# Patient Record
Sex: Female | Born: 1990 | Race: White | Hispanic: No | Marital: Single | Smoking: Never smoker
Health system: Southern US, Community
[De-identification: ages and names within clinical notes are randomized; demographics above are authoritative.]

## PROBLEM LIST (undated history)

## (undated) ENCOUNTER — Inpatient Hospital Stay (HOSPITAL_COMMUNITY): Payer: Self-pay

## (undated) DIAGNOSIS — Z87410 Personal history of cervical dysplasia: Secondary | ICD-10-CM

## (undated) DIAGNOSIS — Z973 Presence of spectacles and contact lenses: Secondary | ICD-10-CM

## (undated) DIAGNOSIS — F3281 Premenstrual dysphoric disorder: Secondary | ICD-10-CM

## (undated) DIAGNOSIS — N920 Excessive and frequent menstruation with regular cycle: Secondary | ICD-10-CM

## (undated) DIAGNOSIS — T7840XA Allergy, unspecified, initial encounter: Secondary | ICD-10-CM

## (undated) DIAGNOSIS — T8339XA Other mechanical complication of intrauterine contraceptive device, initial encounter: Secondary | ICD-10-CM

## (undated) DIAGNOSIS — U071 COVID-19: Secondary | ICD-10-CM

## (undated) DIAGNOSIS — G43829 Menstrual migraine, not intractable, without status migrainosus: Secondary | ICD-10-CM

## (undated) DIAGNOSIS — M1990 Unspecified osteoarthritis, unspecified site: Secondary | ICD-10-CM

## (undated) DIAGNOSIS — Z8616 Personal history of COVID-19: Secondary | ICD-10-CM

## (undated) DIAGNOSIS — J310 Chronic rhinitis: Secondary | ICD-10-CM

## (undated) DIAGNOSIS — J45909 Unspecified asthma, uncomplicated: Secondary | ICD-10-CM

## (undated) DIAGNOSIS — F419 Anxiety disorder, unspecified: Secondary | ICD-10-CM

## (undated) DIAGNOSIS — O09299 Supervision of pregnancy with other poor reproductive or obstetric history, unspecified trimester: Secondary | ICD-10-CM

## (undated) DIAGNOSIS — Z9109 Other allergy status, other than to drugs and biological substances: Secondary | ICD-10-CM

## (undated) HISTORY — DX: Menstrual migraine, not intractable, without status migrainosus: G43.829

## (undated) HISTORY — DX: Other mechanical complication of intrauterine contraceptive device, initial encounter: T83.39XA

## (undated) HISTORY — PX: LAPAROSCOPY: SHX197

## (undated) HISTORY — DX: Anxiety disorder, unspecified: F41.9

## (undated) HISTORY — PX: DENTAL RESTORATION/EXTRACTION WITH X-RAY: SHX5796

## (undated) HISTORY — DX: Allergy, unspecified, initial encounter: T78.40XA

## (undated) HISTORY — DX: Premenstrual dysphoric disorder: F32.81

---
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
## 2010-08-09 HISTORY — PX: IUD REMOVAL: SHX5392

## 2010-08-09 HISTORY — PX: LAPAROSCOPY: SHX197

## 2013-08-09 NOTE — L&D Delivery Note (Signed)
Delivery Note At 6:21 PM a viable and healthy female was delivered via Vaginal, Spontaneous Delivery (Presentation: Right Occiput Anterior).  APGAR: 8, 9; weight 7 lb 3.3 oz (3270 g).   Placenta status: Intact, Spontaneous.  Cord: 3 vessels with the following complications: None.   Anesthesia: Epidural  Episiotomy: None Lacerations: None Suture Repair: N/A Est. Blood Loss (mL): 100  Mom to postpartum.  Baby to Couplet care / Skin to Skin.  Maurie Boettcherhompson, McKenzie L 07/18/2014, 7:09 PM   I attended the delivery with the resident and I agree with the note above  Adam PhenixJames G Anjanette Gilkey, MD 07/18/2014

## 2013-11-21 ENCOUNTER — Encounter: Payer: Self-pay | Admitting: Family Medicine

## 2013-11-21 ENCOUNTER — Ambulatory Visit (INDEPENDENT_AMBULATORY_CARE_PROVIDER_SITE_OTHER): Payer: BC Managed Care – PPO | Admitting: Family Medicine

## 2013-11-21 VITALS — BP 116/79 | HR 76 | Ht 59.0 in | Wt 110.0 lb

## 2013-11-21 DIAGNOSIS — N926 Irregular menstruation, unspecified: Secondary | ICD-10-CM

## 2013-11-21 DIAGNOSIS — J302 Other seasonal allergic rhinitis: Secondary | ICD-10-CM | POA: Insufficient documentation

## 2013-11-21 DIAGNOSIS — J309 Allergic rhinitis, unspecified: Secondary | ICD-10-CM

## 2013-11-21 DIAGNOSIS — Z3201 Encounter for pregnancy test, result positive: Secondary | ICD-10-CM

## 2013-11-21 DIAGNOSIS — Z8619 Personal history of other infectious and parasitic diseases: Secondary | ICD-10-CM

## 2013-11-21 LAB — POCT URINE PREGNANCY: PREG TEST UR: POSITIVE

## 2013-11-21 NOTE — Progress Notes (Signed)
CC: Alexandra Trujillo is a 23 y.o. female is here for Establish Care   Subjective: HPI:  Very pleasant 23 year old here to establish care  Patient reports first day of last menstrual period was 10/19/2013. Periods are usually extremely predictable, yesterday she noticed that her period was a few days late in the setting of breast engorgement and reflux which was early pregnancy signs of prior pregnancies. She used a home pregnancy test which was positive. She is here to confirm whether or not she is pregnant. She denies any tobacco alcohol or recreational drug use. She occasionally uses ibuprofen however rarely. She denies any prescription drug use in the past year. No prenatal vitamin use in the recent months. She denies any pelvic pain or vaginal bleeding since her last period.  This would be her fourth pregnancy.  Prior pregnancies were unremarkable other than GBS positive status with last pregnancy.  She was a nasal congestion, subjective postnasal drip, nonproductive cough that has been present for the majority of April. Symptoms are mild in severity on a daily basis. Improves with Benadryl however this comes with intolerable sedation.  Denies blood in sputum nor shortness of breath. This occurs annually this time of year when pollen counts are high   Review of Systems - General ROS: negative for - chills, fever, night sweats, weight gain or weight loss Ophthalmic ROS: negative for - decreased vision Psychological ROS: negative for - anxiety or depression ENT ROS: negative for - hearing change, nasal congestion, tinnitus or allergies Hematological and Lymphatic ROS: negative for - bleeding problems, bruising or swollen lymph nodes Respiratory ROS: no cough, shortness of breath, or wheezing Cardiovascular ROS: no chest pain or dyspnea on exertion Gastrointestinal ROS: no abdominal pain, change in bowel habits, or black or bloody stools Genito-Urinary ROS: negative for - genital discharge,  genital ulcers, incontinence or abnormal bleeding from genitals Musculoskeletal ROS: negative for - joint pain or muscle pain Neurological ROS: negative for - headaches or memory loss Dermatological ROS: negative for lumps, mole changes, rash and skin lesion changes  History reviewed. No pertinent past medical history.  History reviewed. No pertinent past surgical history. No family history on file.  History   Social History  . Marital Status: Single    Spouse Name: N/A    Number of Children: N/A  . Years of Education: N/A   Occupational History  . Not on file.   Social History Main Topics  . Smoking status: Never Smoker   . Smokeless tobacco: Not on file  . Alcohol Use: No  . Drug Use: No  . Sexual Activity: Yes    Partners: Male   Other Topics Concern  . Not on file   Social History Narrative  . No narrative on file     Objective: BP 116/79  Pulse 76  Ht 4\' 11"  (1.499 m)  Wt 110 lb (49.896 kg)  BMI 22.21 kg/m2  LMP 10/21/2013  General: Alert and Oriented, No Acute Distress HEENT: Pupils equal, round, reactive to light. Conjunctivae clear.  External ears unremarkable, canals clear with intact TMs with appropriate landmarks.  Middle ear appears open without effusion. Pink inferior turbinates.  Moist mucous membranes, pharynx without inflammation nor lesions however moderate cobblestoning and postnasal drip.  Neck supple without palpable lymphadenopathy nor abnormal masses. Lungs: Clear to auscultation bilaterally, no wheezing/ronchi/rales.  Comfortable work of breathing. Good air movement. Cardiac: Regular rate and rhythm. Normal S1/S2.  No murmurs, rubs, nor gallops.   Extremities: No peripheral edema.  Strong peripheral pulses.  Mental Status: No depression, anxiety, nor agitation. Skin: Warm and dry.  Assessment & Plan: Alexandra Trujillo was seen today for establish care.  Diagnoses and associated orders for this visit:  Missed period - Ambulatory referral to Obstetrics  / Gynecology  Pregnancy test positive - Ambulatory referral to Obstetrics / Gynecology  History of group B Streptococcus (GBS) infection  Seasonal allergies    Missed period with positive pregnancy test: Will refer to her OB/GYN colleagues I encouraged her to start a daily multivitamin. Avoid ibuprofen, instead use Tylenol for minor aches and pains. Continue avoidance of tobacco and alcohol. Consider Unisom for any nausea pending recommendations from OB/GYN. Notify us as soon as possible for any pelvic pain and/or vaginal bleeding. Seasonal allergies: Discussed that Zyrtec is category B for pregnancy, encouraged her to use this as needed for her symptoms unless told otherwise by OB/GYN    Return if symptoms worsen or fail to improve.

## 2013-11-21 NOTE — Addendum Note (Signed)
Addended by: Wyline BeadyMCCRIMMON, Kaisey Huseby C on: 11/21/2013 09:44 AM   Modules accepted: Orders

## 2013-12-17 ENCOUNTER — Encounter: Payer: Self-pay | Admitting: Advanced Practice Midwife

## 2013-12-17 ENCOUNTER — Ambulatory Visit (INDEPENDENT_AMBULATORY_CARE_PROVIDER_SITE_OTHER): Payer: BC Managed Care – PPO | Admitting: Advanced Practice Midwife

## 2013-12-17 VITALS — BP 120/78 | HR 103 | Wt 112.0 lb

## 2013-12-17 DIAGNOSIS — Z3491 Encounter for supervision of normal pregnancy, unspecified, first trimester: Secondary | ICD-10-CM | POA: Insufficient documentation

## 2013-12-17 DIAGNOSIS — Z124 Encounter for screening for malignant neoplasm of cervix: Secondary | ICD-10-CM

## 2013-12-17 DIAGNOSIS — Z3480 Encounter for supervision of other normal pregnancy, unspecified trimester: Secondary | ICD-10-CM

## 2013-12-17 DIAGNOSIS — Z1151 Encounter for screening for human papillomavirus (HPV): Secondary | ICD-10-CM

## 2013-12-17 NOTE — Progress Notes (Signed)
   Subjective:    Alexandra Trujillo is a G4P3 5024w4d being seen today for her first obstetrical visit.  Her obstetrical history is significant for nothing.. Patient does intend to breast feed. Pregnancy history fully reviewed.  Patient reports no bleeding and no cramping.  Filed Vitals:   12/17/13 1036  BP: 120/78  Pulse: 103  Weight: 112 lb (50.803 kg)    HISTORY: OB History  Gravida Para Term Preterm AB SAB TAB Ectopic Multiple Living  4 3        3     # Outcome Date GA Lbr Len/2nd Weight Sex Delivery Anes PTL Lv  4 CUR           3 PAR 03/07/11 82324w4d  7 lb 8 oz (3.402 kg) M      2 PAR 11/14/08 7532w0d  7 lb 2 oz (3.232 kg) M      1 PAR 09/02/07 3664w0d  7 lb 4 oz (3.289 kg) M         History reviewed. No pertinent past medical history. Past Surgical History  Procedure Laterality Date  . Iud removal  2012    malposition   Family History  Problem Relation Age of Onset  . Thyroid disease Maternal Aunt      Exam    Uterus:     Pelvic Exam:    Perineum: No Hemorrhoids, Normal Perineum   Vulva: normal, Bartholin's, Urethra, Skene's normal   Vagina:  normal mucosa, normal discharge   pH: NA   Cervix: multiparous appearance, non-friable   Adnexa: normal adnexa and no mass, fullness, tenderness   Bony Pelvis: average  System: Breast:  normal appearance, no masses or tenderness, No nipple retraction or dimpling, No nipple discharge or bleeding, No axillary or supraclavicular adenopathy, Normal to palpation without dominant masses, Taught monthly breast self examination   Skin: dermatitis noted: rash in bilat feet.     Neurologic: oriented, normal mood, gait normal; reflexes normal and symmetric   Extremities: No edema   HEENT sclera clear, anicteric   Mouth/Teeth mucous membranes moist, pharynx normal without lesions and dental hygiene good   Neck supple and no masses   Cardiovascular: regular rate and rhythm, no murmurs or gallops   Respiratory:  appears well, vitals  normal, no respiratory distress, acyanotic, normal RR, neck free of mass or lymphadenopathy, chest clear, no wheezing, crepitations, rhonchi, normal symmetric air entry   Abdomen: soft, non-tender; bowel sounds normal; no masses,  no organomegaly   Urinary: urethral meatus normal      Assessment:    Pregnancy: G4P3 Patient Active Problem List   Diagnosis Date Noted  . Supervision of normal pregnancy in first trimester 12/17/2013  . History of group B Streptococcus (GBS) infection 11/21/2013  . Seasonal allergies 11/21/2013        Plan:     Initial labs drawn. Prenatal vitamins. Problem list reviewed and updated. Genetic Screening discussed First Screen and Quad Screen: declined.  Ultrasound discussed; fetal survey: requested.  Follow up in 4 weeks. 50% of 30 min visit spent on counseling and coordination of care.    Dorathy KinsmanVirginia Dawt Reeb 12/17/2013

## 2013-12-17 NOTE — Progress Notes (Signed)
Initial Prenatal visit today - 4th pregnancy - Positive Group B last pregnancy

## 2013-12-17 NOTE — Patient Instructions (Signed)
Your estimated due date is 07/25/2014.  Pregnancy - First Trimester During sexual intercourse, millions of sperm go into the vagina. Only 1 sperm will penetrate and fertilize the female egg while it is in the Fallopian tube. One week later, the fertilized egg implants into the wall of the uterus. An embryo begins to develop into a baby. At 6 to 8 weeks, the eyes and face are formed and the heartbeat can be seen on ultrasound. At the end of 12 weeks (first trimester), all the baby's organs are formed. Now that you are pregnant, you will want to do everything you can to have a healthy baby. Two of the most important things are to get good prenatal care and follow your caregiver's instructions. Prenatal care is all the medical care you receive before the baby's birth. It is given to prevent, find, and treat problems during the pregnancy and childbirth. PRENATAL EXAMS  During prenatal visits, your weight, blood pressure, and urine are checked. This is done to make sure you are healthy and progressing normally during the pregnancy.  A pregnant woman should gain 25 to 35 pounds during the pregnancy. However, if you are overweight or underweight, your caregiver will advise you regarding your weight.  Your caregiver will ask and answer questions for you.  Blood work, cervical cultures, other necessary tests, and a Pap test are done during your prenatal exams. These tests are done to check on your health and the probable health of your baby. Tests are strongly recommended and done for HIV with your permission. This is the virus that causes AIDS. These tests are done because medicines can be given to help prevent your baby from being born with this infection should you have been infected without knowing it. Blood work is also used to find out your blood type, previous infections, and follow your blood levels (hemoglobin).  Low hemoglobin (anemia) is common during pregnancy. Iron and vitamins are given to help  prevent this. Later in the pregnancy, blood tests for diabetes will be done along with any other tests if any problems develop.  You may need other tests to make sure you and the baby are doing well. CHANGES DURING THE FIRST TRIMESTER  Your body goes through many changes during pregnancy. They vary from person to person. Talk to your caregiver about changes you notice and are concerned about. Changes can include:  Your menstrual period stops.  The egg and sperm carry the genes that determine what you look like. Genes from you and your partner are forming a baby. The female genes determine whether the baby is a boy or a girl.  Your body increases in girth and you may feel bloated.  Feeling sick to your stomach (nauseous) and throwing up (vomiting). If the vomiting is uncontrollable, call your caregiver.  Your breasts will begin to enlarge and become tender.  Your nipples may stick out more and become darker.  The need to urinate more. Painful urination may mean you have a bladder infection.  Tiring easily.  Loss of appetite.  Cravings for certain kinds of food.  At first, you may gain or lose a couple of pounds.  You may have changes in your emotions from day to day (excited to be pregnant or concerned something may go wrong with the pregnancy and baby).  You may have more vivid and strange dreams. HOME CARE INSTRUCTIONS   It is very important to avoid all smoking, alcohol and non-prescribed drugs during your pregnancy. These affect  the formation and growth of the baby. Avoid chemicals while pregnant to ensure the delivery of a healthy infant.  Start your prenatal visits by the 12th week of pregnancy. They are usually scheduled monthly at first, then more often in the last 2 months before delivery. Keep your caregiver's appointments. Follow your caregiver's instructions regarding medicine use, blood and lab tests, exercise, and diet.  During pregnancy, you are providing food for you  and your baby. Eat regular, well-balanced meals. Choose foods such as meat, fish, milk and other low fat dairy products, vegetables, fruits, and whole-grain breads and cereals. Your caregiver will tell you of the ideal weight gain.  You can help morning sickness by keeping soda crackers at the bedside. Eat a couple before arising in the morning. You may want to use the crackers without salt on them.  Eating 4 to 5 small meals rather than 3 large meals a day also may help the nausea and vomiting.  Drinking liquids between meals instead of during meals also seems to help nausea and vomiting.  A physical sexual relationship may be continued throughout pregnancy if there are no other problems. Problems may be early (premature) leaking of amniotic fluid from the membranes, vaginal bleeding, or belly (abdominal) pain.  Exercise regularly if there are no restrictions. Check with your caregiver or physical therapist if you are unsure of the safety of some of your exercises. Greater weight gain will occur in the last 2 trimesters of pregnancy. Exercising will help:  Control your weight.  Keep you in shape.  Prepare you for labor and delivery.  Help you lose your pregnancy weight after you deliver your baby.  Wear a good support or jogging bra for breast tenderness during pregnancy. This may help if worn during sleep too.  Ask when prenatal classes are available. Begin classes when they are offered.  Do not use hot tubs, steam rooms, or saunas.  Wear your seat belt when driving. This protects you and your baby if you are in an accident.  Avoid raw meat, uncooked cheese, cat litter boxes, and soil used by cats throughout the pregnancy. These carry germs that can cause birth defects in the baby.  The first trimester is a good time to visit your dentist for your dental health. Getting your teeth cleaned is okay. Use a softer toothbrush and brush gently during pregnancy.  Ask for help if you have  financial, counseling, or nutritional needs during pregnancy. Your caregiver will be able to offer counseling for these needs as well as refer you for other special needs.  Do not take any medicines or herbs unless told by your caregiver.  Inform your caregiver if there is any mental or physical domestic violence.  Make a list of emergency phone numbers of family, friends, hospital, and police and fire departments.  Write down your questions. Take them to your prenatal visit.  Do not douche.  Do not cross your legs.  If you have to stand for long periods of time, rotate you feet or take small steps in a circle.  You may have more vaginal secretions that may require a sanitary pad. Do not use tampons or scented sanitary pads. MEDICINES AND DRUG USE IN PREGNANCY  Take prenatal vitamins as directed. The vitamin should contain 1 milligram of folic acid. Keep all vitamins out of reach of children. Only a couple vitamins or tablets containing iron may be fatal to a baby or young child when ingested.  Avoid use of  all medicines, including herbs, over-the-counter medicines, not prescribed or suggested by your caregiver. Only take over-the-counter or prescription medicines for pain, discomfort, or fever as directed by your caregiver. Do not use aspirin, ibuprofen, or naproxen unless directed by your caregiver.  Let your caregiver also know about herbs you may be using.  Alcohol is related to a number of birth defects. This includes fetal alcohol syndrome. All alcohol, in any form, should be avoided completely. Smoking will cause low birth rate and premature babies.  Street or illegal drugs are very harmful to the baby. They are absolutely forbidden. A baby born to an addicted mother will be addicted at birth. The baby will go through the same withdrawal an adult does.  Let your caregiver know about any medicines that you have to take and for what reason you take them. SEEK MEDICAL CARE IF:  You  have any concerns or worries during your pregnancy. It is better to call with your questions if you feel they cannot wait, rather than worry about them. SEEK IMMEDIATE MEDICAL CARE IF:   An unexplained oral temperature above 102 F (38.9 C) develops, or as your caregiver suggests.  You have leaking of fluid from the vagina (birth canal). If leaking membranes are suspected, take your temperature and inform your caregiver of this when you call.  There is vaginal spotting or bleeding. Notify your caregiver of the amount and how many pads are used.  You develop a bad smelling vaginal discharge with a change in the color.  You continue to feel sick to your stomach (nauseated) and have no relief from remedies suggested. You vomit blood or coffee ground-like materials.  You lose more than 2 pounds of weight in 1 week.  You gain more than 2 pounds of weight in 1 week and you notice swelling of your face, hands, feet, or legs.  You gain 5 pounds or more in 1 week (even if you do not have swelling of your hands, face, legs, or feet).  You get exposed to [place] measles and have never had them.  You are exposed to fifth disease or chickenpox.  You develop belly (abdominal) pain. Round ligament discomfort is a common non-cancerous (benign) cause of abdominal pain in pregnancy. Your caregiver still must evaluate this.  You develop headache, fever, diarrhea, pain with urination, or shortness of breath.  You fall or are in a car accident or have any kind of trauma.  There is mental or physical violence in your home. Document Released: 07/20/2001 Document Revised: 04/19/2012 Document Reviewed: 01/21/2009 [facility] Patient Information 2014 [place], [place].   Breastfeeding Deciding to breastfeed is one of the best choices you can make for you and your baby. A change in hormones during pregnancy causes your breast tissue to grow and increases the number and size of your milk ducts. These hormones also  allow proteins, sugars, and fats from your blood supply to make breast milk in your milk-producing glands. Hormones prevent breast milk from being released before your baby is born as well as prompt milk flow after birth. Once breastfeeding has begun, thoughts of your baby, as well as his or her sucking or crying, can stimulate the release of milk from your milk-producing glands.  BENEFITS OF BREASTFEEDING For Your Baby  Your first milk (colostrum) helps your baby's digestive system function better.   There are antibodies in your milk that help your baby fight off infections.   Your baby has a lower incidence of asthma, allergies, and  sudden infant death syndrome.   The nutrients in breast milk are better for your baby than infant formulas and are designed uniquely for your baby's needs.   Breast milk improves your baby's brain development.   Your baby is less likely to develop other conditions, such as childhood obesity, asthma, or type 2 diabetes mellitus.  For You   Breastfeeding helps to create a very special bond between you and your baby.   Breastfeeding is convenient. Breast milk is always available at the correct temperature and costs nothing.   Breastfeeding helps to burn calories and helps you lose the weight gained during pregnancy.   Breastfeeding makes your uterus contract to its prepregnancy size faster and slows bleeding (lochia) after you give birth.   Breastfeeding helps to lower your risk of developing type 2 diabetes mellitus, osteoporosis, and breast or ovarian cancer later in life. SIGNS THAT YOUR BABY IS HUNGRY Early Signs of Hunger  Increased alertness or activity.  Stretching.  Movement of the head from side to side.  Movement of the head and opening of the mouth when the corner of the mouth or cheek is stroked (rooting).  Increased sucking sounds, smacking lips, cooing, sighing, or squeaking.  Hand-to-mouth movements.  Increased sucking of  fingers or hands. Late Signs of Hunger  Fussing.  Intermittent crying. Extreme Signs of Hunger Signs of extreme hunger will require calming and consoling before your baby will be able to breastfeed successfully. Do not wait for the following signs of extreme hunger to occur before you initiate breastfeeding:   Restlessness.  A loud, strong cry.   Screaming. BREASTFEEDING BASICS Breastfeeding Initiation  Find a comfortable place to sit or lie down, with your neck and back well supported.  Place a pillow or rolled up blanket under your baby to bring him or her to the level of your breast (if you are seated). Nursing pillows are specially designed to help support your arms and your baby while you breastfeed.  Make sure that your baby's abdomen is facing your abdomen.   Gently massage your breast. With your fingertips, massage from your chest wall toward your nipple in a circular motion. This encourages milk flow. You may need to continue this action during the feeding if your milk flows slowly.  Support your breast with 4 fingers underneath and your thumb above your nipple. Make sure your fingers are well away from your nipple and your baby's mouth.   Stroke your baby's lips gently with your finger or nipple.   When your baby's mouth is open wide enough, quickly bring your baby to your breast, placing your entire nipple and as much of the colored area around your nipple (areola) as possible into your baby's mouth.   More areola should be visible above your baby's upper lip than below the lower lip.   Your baby's tongue should be between his or her lower gum and your breast.   Ensure that your baby's mouth is correctly positioned around your nipple (latched). Your baby's lips should create a seal on your breast and be turned out (everted).  It is common for your baby to suck about 2 3 minutes in order to start the flow of breast milk. Latching Teaching your baby how to latch  on to your breast properly is very important. An improper latch can cause nipple pain and decreased milk supply for you and poor weight gain in your baby. Also, if your baby is not latched onto your nipple properly,  he or she may swallow some air during feeding. This can make your baby fussy. Burping your baby when you switch breasts during the feeding can help to get rid of the air. However, teaching your baby to latch on properly is still the best way to prevent fussiness from swallowing air while breastfeeding. Signs that your baby has successfully latched on to your nipple:    Silent tugging or silent sucking, without causing you pain.   Swallowing heard between every 3 4 sucks.    Muscle movement above and in front of his or her ears while sucking.  Signs that your baby has not successfully latched on to nipple:   Sucking sounds or smacking sounds from your baby while breastfeeding.  Nipple pain. If you think your baby has not latched on correctly, slip your finger into the corner of your baby's mouth to break the suction and place it between your baby's gums. Attempt breastfeeding initiation again. Signs of Successful Breastfeeding Signs from your baby:   A gradual decrease in the number of sucks or complete cessation of sucking.   Falling asleep.   Relaxation of his or her body.   Retention of a small amount of milk in his or her mouth.   Letting go of your breast by himself or herself. Signs from you:  Breasts that have increased in firmness, weight, and size 1 3 hours after feeding.   Breasts that are softer immediately after breastfeeding.  Increased milk volume, as well as a change in milk consistency and color by the 5th day of breastfeeding.   Nipples that are not sore, cracked, or bleeding. Signs That Your Pecola Leisure is Getting Enough Milk  Wetting at least 3 diapers in a 24-hour period. The urine should be clear and pale yellow by age 93 days.  At least 3  stools in a 24-hour period by age 93 days. The stool should be soft and yellow.  At least 3 stools in a 24-hour period by age 659 days. The stool should be seedy and yellow.  No loss of weight greater than 10% of birth weight during the first 82 days of age.  Average weight gain of 4 7 ounces (120 210 mL) per week after age 653 days.  Consistent daily weight gain by age 93 days, without weight loss after the age of 2 weeks. After a feeding, your baby may spit up a small amount. This is common. BREASTFEEDING FREQUENCY AND DURATION Frequent feeding will help you make more milk and can prevent sore nipples and breast engorgement. Breastfeed when you feel the need to reduce the fullness of your breasts or when your baby shows signs of hunger. This is called "breastfeeding on demand." Avoid introducing a pacifier to your baby while you are working to establish breastfeeding (the first 4 6 weeks after your baby is born). After this time you may choose to use a pacifier. Research has shown that pacifier use during the first year of a baby's life decreases the risk of sudden infant death syndrome (SIDS). Allow your baby to feed on each breast as long as he or she wants. Breastfeed until your baby is finished feeding. When your baby unlatches or falls asleep while feeding from the first breast, offer the second breast. Because newborns are often sleepy in the first few weeks of life, you may need to awaken your baby to get him or her to feed. Breastfeeding times will vary from baby to baby. However, the following rules  can serve as a guide to help you ensure that your baby is properly fed:  Newborns (babies 49 weeks of age or younger) may breastfeed every 1 3 hours.  Newborns should not go longer than 3 hours during the day or 5 hours during the night without breastfeeding.  You should breastfeed your baby a minimum of 8 times in a 24-hour period until you begin to introduce solid foods to your baby at around 58  months of age. BREAST MILK PUMPING Pumping and storing breast milk allows you to ensure that your baby is exclusively fed your breast milk, even at times when you are unable to breastfeed. This is especially important if you are going back to work while you are still breastfeeding or when you are not able to be present during feedings. Your lactation consultant can give you guidelines on how long it is safe to store breast milk.  A breast pump is a machine that allows you to pump milk from your breast into a sterile bottle. The pumped breast milk can then be stored in a refrigerator or freezer. Some breast pumps are operated by hand, while others use electricity. Ask your lactation consultant which type will work best for you. Breast pumps can be purchased, but some hospitals and breastfeeding support groups lease breast pumps on a monthly basis. A lactation consultant can teach you how to hand express breast milk, if you prefer not to use a pump.  CARING FOR YOUR BREASTS WHILE YOU BREASTFEED Nipples can become dry, cracked, and sore while breastfeeding. The following recommendations can help keep your breasts moisturized and healthy:  Avoid using soap on your nipples.   Wear a supportive bra. Although not required, special nursing bras and tank tops are designed to allow access to your breasts for breastfeeding without taking off your entire bra or top. Avoid wearing underwire style bras or extremely tight bras.  Air dry your nipples for 3 after each feeding.   Use only cotton bra pads to absorb leaked breast milk. Leaking of breast milk between feedings is normal.   Use lanolin on your nipples after breastfeeding. Lanolin helps to maintain your skin's normal moisture barrier. If you use pure lanolin you do not need to wash it off before feeding your baby again. Pure lanolin is not toxic to your baby. You may also hand express a few drops of breast milk and gently massage that milk into  your nipples and allow the milk to air dry. In the first few weeks after giving birth, some women experience extremely full breasts (engorgement). Engorgement can make your breasts feel heavy, warm, and tender to the touch. Engorgement peaks within 3 5 days after you give birth. The following recommendations can help ease engorgement:  Completely empty your breasts while breastfeeding or pumping. You may want to start by applying warm, moist heat (in the shower or with warm water-soaked hand towels) just before feeding or pumping. This increases circulation and helps the milk flow. If your baby does not completely empty your breasts while breastfeeding, pump any extra milk after he or she is finished.  Wear a snug bra (nursing or regular) or tank top for 1 2 days to signal your body to slightly decrease milk production.  Apply ice packs to your breasts, unless this is too uncomfortable for you.  Make sure that your baby is latched on and positioned properly while breastfeeding. If engorgement persists after 48 hours of following these recommendations, contact your  health care provider or a Advertising copywriterlactation consultant. OVERALL HEALTH CARE RECOMMENDATIONS WHILE BREASTFEEDING  Eat healthy foods. Alternate between meals and snacks, eating 3 of each per day. Because what you eat affects your breast milk, some of the foods may make your baby more irritable than usual. Avoid eating these foods if you are sure that they are negatively affecting your baby.  Drink milk, fruit juice, and water to satisfy your thirst (about 10 glasses a day).   Rest often, relax, and continue to take your prenatal vitamins to prevent fatigue, stress, and anemia.  Continue breast self-awareness checks.  Avoid chewing and smoking tobacco.  Avoid alcohol and drug use. Some medicines that may be harmful to your baby can pass through breast milk. It is important to ask your health care provider before taking any medicine, including  all over-the-counter and prescription medicine as well as vitamin and herbal supplements. It is possible to become pregnant while breastfeeding. If birth control is desired, ask your health care provider about options that will be safe for your baby. SEEK MEDICAL CARE IF:   You feel like you want to stop breastfeeding or have become frustrated with breastfeeding.  You have painful breasts or nipples.  Your nipples are cracked or bleeding.  Your breasts are red, tender, or warm.  You have a swollen area on either breast.  You have a fever or chills.  You have nausea or vomiting.  You have drainage other than breast milk from your nipples.  Your breasts do not become full before feedings by the 5th day after you give birth.  You feel sad and depressed.  Your baby is too sleepy to eat well.  Your baby is having trouble sleeping.   Your baby is wetting less than 3 diapers in a 24-hour period.  Your baby has less than 3 stools in a 24-hour period.  Your baby's skin or the white part of his or her eyes becomes yellow.   Your baby is not gaining weight by 215 days of age. SEEK IMMEDIATE MEDICAL CARE IF:   Your baby is overly tired (lethargic) and does not want to wake up and feed.  Your baby develops an unexplained fever. Document Released: 07/26/2005 Document Revised: 03/28/2013 Document Reviewed: 01/17/2013 [facility]Exit[facility] Patient Information 2014 [place]ExitCare, [place].

## 2013-12-18 LAB — OBSTETRIC PANEL
Antibody Screen: NEGATIVE
BASOS ABS: 0 10*3/uL (ref 0.0–0.1)
BASOS PCT: 0 % (ref 0–1)
EOS ABS: 0.1 10*3/uL (ref 0.0–0.7)
Eosinophils Relative: 1 % (ref 0–5)
HCT: 36.7 % (ref 36.0–46.0)
HEMOGLOBIN: 12.4 g/dL (ref 12.0–15.0)
HEP B S AG: NEGATIVE
Lymphocytes Relative: 20 % (ref 12–46)
Lymphs Abs: 1.9 10*3/uL (ref 0.7–4.0)
MCH: 29.8 pg (ref 26.0–34.0)
MCHC: 33.8 g/dL (ref 30.0–36.0)
MCV: 88.2 fL (ref 78.0–100.0)
MONOS PCT: 4 % (ref 3–12)
Monocytes Absolute: 0.4 10*3/uL (ref 0.1–1.0)
NEUTROS PCT: 75 % (ref 43–77)
Neutro Abs: 7 10*3/uL (ref 1.7–7.7)
Platelets: 190 10*3/uL (ref 150–400)
RBC: 4.16 MIL/uL (ref 3.87–5.11)
RDW: 13.6 % (ref 11.5–15.5)
RH TYPE: POSITIVE
Rubella: 0.75 Index (ref <0.90)
WBC: 9.3 10*3/uL (ref 4.0–10.5)

## 2013-12-18 LAB — HIV ANTIBODY (ROUTINE TESTING W REFLEX): HIV 1&2 Ab, 4th Generation: NONREACTIVE

## 2013-12-18 LAB — GC/CHLAMYDIA PROBE AMP
CT PROBE, AMP APTIMA: NEGATIVE
GC Probe RNA: NEGATIVE

## 2013-12-18 LAB — CULTURE, OB URINE

## 2014-01-14 ENCOUNTER — Ambulatory Visit (INDEPENDENT_AMBULATORY_CARE_PROVIDER_SITE_OTHER): Payer: BC Managed Care – PPO | Admitting: Advanced Practice Midwife

## 2014-01-14 VITALS — BP 110/69 | HR 83 | Wt 114.0 lb

## 2014-01-14 DIAGNOSIS — Z3480 Encounter for supervision of other normal pregnancy, unspecified trimester: Secondary | ICD-10-CM

## 2014-01-14 NOTE — Progress Notes (Signed)
Doing well.  Denies vaginal bleeding, LOF, cramping/contractions.  Denies problems/questions today.  Hx GBS positive with one pregnancy, was on problem list.  No complications or problems with baby r/t GBS so removed from pt chart. Plan for screen for GBS at 35-37 weeks this pregnancy.

## 2014-02-11 ENCOUNTER — Encounter: Payer: Self-pay | Admitting: Family

## 2014-02-11 ENCOUNTER — Ambulatory Visit (INDEPENDENT_AMBULATORY_CARE_PROVIDER_SITE_OTHER): Payer: BC Managed Care – PPO | Admitting: Family

## 2014-02-11 VITALS — BP 126/78 | HR 105 | Wt 115.0 lb

## 2014-02-11 DIAGNOSIS — O09299 Supervision of pregnancy with other poor reproductive or obstetric history, unspecified trimester: Secondary | ICD-10-CM

## 2014-02-11 DIAGNOSIS — Z2839 Other underimmunization status: Secondary | ICD-10-CM

## 2014-02-11 DIAGNOSIS — Z3491 Encounter for supervision of normal pregnancy, unspecified, first trimester: Secondary | ICD-10-CM

## 2014-02-11 DIAGNOSIS — O09292 Supervision of pregnancy with other poor reproductive or obstetric history, second trimester: Secondary | ICD-10-CM

## 2014-02-11 DIAGNOSIS — O09899 Supervision of other high risk pregnancies, unspecified trimester: Secondary | ICD-10-CM | POA: Insufficient documentation

## 2014-02-11 DIAGNOSIS — Z3480 Encounter for supervision of other normal pregnancy, unspecified trimester: Secondary | ICD-10-CM

## 2014-02-11 DIAGNOSIS — O9989 Other specified diseases and conditions complicating pregnancy, childbirth and the puerperium: Secondary | ICD-10-CM

## 2014-02-11 DIAGNOSIS — Z283 Underimmunization status: Secondary | ICD-10-CM

## 2014-02-11 HISTORY — DX: Supervision of pregnancy with other poor reproductive or obstetric history, unspecified trimester: O09.299

## 2014-02-11 NOTE — Progress Notes (Signed)
Reviewed OB labs, rubella non-immune, MMR pp.  Scheduled anatomy ultrasound.

## 2014-03-04 ENCOUNTER — Ambulatory Visit (HOSPITAL_COMMUNITY)
Admission: RE | Admit: 2014-03-04 | Discharge: 2014-03-04 | Disposition: A | Discharge status: Home / Self Care | Payer: Federal, State, Local not specified - PPO | Source: Ambulatory Visit | Attending: Family | Admitting: Family

## 2014-03-04 DIAGNOSIS — Z3491 Encounter for supervision of normal pregnancy, unspecified, first trimester: Secondary | ICD-10-CM

## 2014-03-04 DIAGNOSIS — Z3689 Encounter for other specified antenatal screening: Secondary | ICD-10-CM | POA: Insufficient documentation

## 2014-03-05 ENCOUNTER — Encounter: Payer: Self-pay | Admitting: Family

## 2014-03-11 ENCOUNTER — Ambulatory Visit (INDEPENDENT_AMBULATORY_CARE_PROVIDER_SITE_OTHER): Payer: BC Managed Care – PPO | Admitting: Advanced Practice Midwife

## 2014-03-11 ENCOUNTER — Encounter: Payer: Self-pay | Admitting: Advanced Practice Midwife

## 2014-03-11 VITALS — BP 119/71 | HR 119 | Wt 119.0 lb

## 2014-03-11 DIAGNOSIS — Z3480 Encounter for supervision of other normal pregnancy, unspecified trimester: Secondary | ICD-10-CM

## 2014-03-11 DIAGNOSIS — Z3492 Encounter for supervision of normal pregnancy, unspecified, second trimester: Secondary | ICD-10-CM

## 2014-03-11 NOTE — Patient Instructions (Signed)
Second Trimester of Pregnancy The second trimester is from week 13 through week 28, months 4 through 6. The second trimester is often a time when you feel your best. Your body has also adjusted to being pregnant, and you begin to feel better physically. Usually, morning sickness has lessened or quit completely, you may have more energy, and you may have an increase in appetite. The second trimester is also a time when the fetus is growing rapidly. At the end of the sixth month, the fetus is about 9 inches long and weighs about 1 pounds. You will likely begin to feel the baby move (quickening) between 18 and 20 weeks of the pregnancy. BODY CHANGES Your body goes through many changes during pregnancy. The changes vary from woman to woman.   Your weight will continue to increase. You will notice your lower abdomen bulging out.  You may begin to get stretch marks on your hips, abdomen, and breasts.  You may develop headaches that can be relieved by medicines approved by your health care provider.  You may urinate more often because the fetus is pressing on your bladder.  You may develop or continue to have heartburn as a result of your pregnancy.  You may develop constipation because certain hormones are causing the muscles that push waste through your intestines to slow down.  You may develop hemorrhoids or swollen, bulging veins (varicose veins).  You may have back pain because of the weight gain and pregnancy hormones relaxing your joints between the bones in your pelvis and as a result of a shift in weight and the muscles that support your balance.  Your breasts will continue to grow and be tender.  Your gums may bleed and may be sensitive to brushing and flossing.  Dark spots or blotches (chloasma, mask of pregnancy) may develop on your face. This will likely fade after the baby is born.  A dark line from your belly button to the pubic area (linea nigra) may appear. This will likely fade  after the baby is born.  You may have changes in your hair. These can include thickening of your hair, rapid growth, and changes in texture. Some women also have hair loss during or after pregnancy, or hair that feels dry or thin. Your hair will most likely return to normal after your baby is born. WHAT TO EXPECT AT YOUR PRENATAL VISITS During a routine prenatal visit:  You will be weighed to make sure you and the fetus are growing normally.  Your blood pressure will be taken.  Your abdomen will be measured to track your baby's growth.  The fetal heartbeat will be listened to.  Any test results from the previous visit will be discussed. Your health care provider may ask you:  How you are feeling.  If you are feeling the baby move.  If you have had any abnormal symptoms, such as leaking fluid, bleeding, severe headaches, or abdominal cramping.  If you have any questions. Other tests that may be performed during your second trimester include:  Blood tests that check for:  Low iron levels (anemia).  Gestational diabetes (between 24 and 28 weeks).  Rh antibodies.  Urine tests to check for infections, diabetes, or protein in the urine.  An ultrasound to confirm the proper growth and development of the baby.  An amniocentesis to check for possible genetic problems.  Fetal screens for spina bifida and Down syndrome. HOME CARE INSTRUCTIONS   Avoid all smoking, herbs, alcohol, and unprescribed   drugs. These chemicals affect the formation and growth of the baby.  Follow your health care provider's instructions regarding medicine use. There are medicines that are either safe or unsafe to take during pregnancy.  Exercise only as directed by your health care provider. Experiencing uterine cramps is a good sign to stop exercising.  Continue to eat regular, healthy meals.  Wear a good support bra for breast tenderness.  Do not use hot tubs, steam rooms, or saunas.  Wear your  seat belt at all times when driving.  Avoid raw meat, uncooked cheese, cat litter boxes, and soil used by cats. These carry germs that can cause birth defects in the baby.  Take your prenatal vitamins.  Try taking a stool softener (if your health care provider approves) if you develop constipation. Eat more high-fiber foods, such as fresh vegetables or fruit and whole grains. Drink plenty of fluids to keep your urine clear or pale yellow.  Take warm sitz baths to soothe any pain or discomfort caused by hemorrhoids. Use hemorrhoid cream if your health care provider approves.  If you develop varicose veins, wear support hose. Elevate your feet for 15 minutes, 3-4 times a day. Limit salt in your diet.  Avoid heavy lifting, wear low heel shoes, and practice good posture.  Rest with your legs elevated if you have leg cramps or low back pain.  Visit your dentist if you have not gone yet during your pregnancy. Use a soft toothbrush to brush your teeth and be gentle when you floss.  A sexual relationship may be continued unless your health care provider directs you otherwise.  Continue to go to all your prenatal visits as directed by your health care provider. SEEK MEDICAL CARE IF:   You have dizziness.  You have mild pelvic cramps, pelvic pressure, or nagging pain in the abdominal area.  You have persistent nausea, vomiting, or diarrhea.  You have a bad smelling vaginal discharge.  You have pain with urination. SEEK IMMEDIATE MEDICAL CARE IF:   You have a fever.  You are leaking fluid from your vagina.  You have spotting or bleeding from your vagina.  You have severe abdominal cramping or pain.  You have rapid weight gain or loss.  You have shortness of breath with chest pain.  You notice sudden or extreme swelling of your face, hands, ankles, feet, or legs.  You have not felt your baby move in over an hour.  You have severe headaches that do not go away with  medicine.  You have vision changes. Document Released: 07/20/2001 Document Revised: 07/31/2013 Document Reviewed: 09/26/2012 ExitCare Patient Information 2015 [employer]. This information is not intended to replace advice given to you by your health care provider. Make sure you discuss any questions you have with your health care provider.  

## 2014-03-11 NOTE — Progress Notes (Signed)
Has some sciatica on left. Has had it before. Will try heel lift. Anatomy US reviewed as normal with incomplete views of spine. Will schedule followup

## 2014-03-11 NOTE — Progress Notes (Signed)
Left sciatic pain

## 2014-04-01 ENCOUNTER — Ambulatory Visit (HOSPITAL_COMMUNITY)
Admission: RE | Admit: 2014-04-01 | Discharge: 2014-04-01 | Disposition: A | Discharge status: Home / Self Care | Payer: Federal, State, Local not specified - PPO | Source: Ambulatory Visit | Attending: Advanced Practice Midwife | Admitting: Advanced Practice Midwife

## 2014-04-01 DIAGNOSIS — Z3689 Encounter for other specified antenatal screening: Secondary | ICD-10-CM | POA: Diagnosis not present

## 2014-04-01 DIAGNOSIS — Z3492 Encounter for supervision of normal pregnancy, unspecified, second trimester: Secondary | ICD-10-CM

## 2014-04-01 DIAGNOSIS — O358XX0 Maternal care for other (suspected) fetal abnormality and damage, not applicable or unspecified: Secondary | ICD-10-CM | POA: Diagnosis present

## 2014-04-09 ENCOUNTER — Ambulatory Visit (INDEPENDENT_AMBULATORY_CARE_PROVIDER_SITE_OTHER): Payer: Federal, State, Local not specified - PPO | Admitting: Obstetrics & Gynecology

## 2014-04-09 ENCOUNTER — Encounter: Payer: Self-pay | Admitting: Obstetrics & Gynecology

## 2014-04-09 VITALS — BP 129/74 | HR 89 | Wt 124.0 lb

## 2014-04-09 DIAGNOSIS — Z3480 Encounter for supervision of other normal pregnancy, unspecified trimester: Secondary | ICD-10-CM

## 2014-04-09 DIAGNOSIS — Z3491 Encounter for supervision of normal pregnancy, unspecified, first trimester: Secondary | ICD-10-CM

## 2014-04-09 NOTE — Progress Notes (Signed)
No complaints.   GCT in 3 weeks.

## 2014-04-29 ENCOUNTER — Ambulatory Visit (INDEPENDENT_AMBULATORY_CARE_PROVIDER_SITE_OTHER): Payer: Federal, State, Local not specified - PPO | Admitting: Advanced Practice Midwife

## 2014-04-29 VITALS — BP 114/71 | HR 89 | Wt 125.0 lb

## 2014-04-29 DIAGNOSIS — Z3482 Encounter for supervision of other normal pregnancy, second trimester: Secondary | ICD-10-CM

## 2014-04-29 DIAGNOSIS — Z3480 Encounter for supervision of other normal pregnancy, unspecified trimester: Secondary | ICD-10-CM

## 2014-04-29 NOTE — Progress Notes (Signed)
Doing well.  Good fetal movement, denies vaginal bleeding, LOF, regular contractions.  Glucose screen today.

## 2014-04-30 LAB — GLUCOSE TOLERANCE, 1 HOUR (50G) W/O FASTING: Glucose, 1 Hour GTT: 109 mg/dL (ref 70–140)

## 2014-04-30 LAB — CBC
HCT: 32.6 % — ABNORMAL LOW (ref 36.0–46.0)
Hemoglobin: 11.5 g/dL — ABNORMAL LOW (ref 12.0–15.0)
MCH: 32.2 pg (ref 26.0–34.0)
MCHC: 35.3 g/dL (ref 30.0–36.0)
MCV: 91.3 fL (ref 78.0–100.0)
Platelets: 158 10*3/uL (ref 150–400)
RBC: 3.57 MIL/uL — ABNORMAL LOW (ref 3.87–5.11)
RDW: 13.6 % (ref 11.5–15.5)
WBC: 9.6 10*3/uL (ref 4.0–10.5)

## 2014-04-30 LAB — HIV ANTIBODY (ROUTINE TESTING W REFLEX): HIV: NONREACTIVE

## 2014-05-01 ENCOUNTER — Encounter: Payer: Self-pay | Admitting: Advanced Practice Midwife

## 2014-05-01 ENCOUNTER — Telehealth: Payer: Self-pay | Admitting: *Deleted

## 2014-05-01 LAB — RPR

## 2014-05-01 NOTE — Telephone Encounter (Signed)
Called pt to adv 1HrGTT was normal and I verified she is taking her prenatal vitamins. I adv she needs to take a iron supplement OTC in addition to prenatal vitamins. Pt expressed understanding.

## 2014-05-13 ENCOUNTER — Ambulatory Visit (INDEPENDENT_AMBULATORY_CARE_PROVIDER_SITE_OTHER): Payer: Federal, State, Local not specified - PPO | Admitting: Advanced Practice Midwife

## 2014-05-13 VITALS — BP 109/64 | HR 96 | Wt 126.0 lb

## 2014-05-13 DIAGNOSIS — R0602 Shortness of breath: Secondary | ICD-10-CM

## 2014-05-13 DIAGNOSIS — Z3481 Encounter for supervision of other normal pregnancy, first trimester: Secondary | ICD-10-CM

## 2014-05-13 DIAGNOSIS — Z23 Encounter for immunization: Secondary | ICD-10-CM

## 2014-05-13 DIAGNOSIS — Z3491 Encounter for supervision of normal pregnancy, unspecified, first trimester: Secondary | ICD-10-CM

## 2014-05-13 MED ORDER — TETANUS-DIPHTH-ACELL PERTUSSIS 5-2.5-18.5 LF-MCG/0.5 IM SUSP
0.5000 mL | Freq: Once | INTRAMUSCULAR | Status: AC
  Administered 2014-05-13: 0.5 mL via INTRAMUSCULAR

## 2014-05-13 NOTE — Progress Notes (Signed)
Pt request Tdap but refuses FLU vaccine

## 2014-05-13 NOTE — Patient Instructions (Signed)
Zyrtec and Benadryl  Tdap Vaccine (Tetanus, Diphtheria, Pertussis): What You Need to Know 1. Why get vaccinated? Tetanus, diphtheria and pertussis can be very serious diseases, even for adolescents and adults. Tdap vaccine can protect [place] from these diseases. TETANUS (Lockjaw) causes painful muscle tightening and stiffness, usually all over the body.  It can lead to tightening of muscles in the head and neck so you can't open your mouth, swallow, or sometimes even breathe. Tetanus kills about 1 out of 5 people who are infected. DIPHTHERIA can cause a thick coating to form in the back of the throat.  It can lead to breathing problems, paralysis, heart failure, and death. PERTUSSIS (Whooping Cough) causes severe coughing spells, which can cause difficulty breathing, vomiting and disturbed sleep.  It can also lead to weight loss, incontinence, and rib fractures. Up to 2 in 100 adolescents and 5 in 100 adults with pertussis are hospitalized or have complications, which could include pneumonia or death. These diseases are caused by bacteria. Diphtheria and pertussis are spread from person to person through coughing or sneezing. Tetanus enters the body through cuts, scratches, or wounds. Before vaccines, the [place] saw as many as 200,000 cases a year of diphtheria and pertussis, and hundreds of cases of tetanus. Since vaccination began, tetanus and diphtheria have dropped by about 99% and pertussis by about 80%. 2. Tdap vaccine Tdap vaccine can protect adolescents and adults from tetanus, diphtheria, and pertussis. One dose of Tdap is routinely given at age 40 or 61. People who did not get Tdap at that age should get it as soon as possible. Tdap is especially important for health care professionals and anyone having close contact with a baby younger than 12 months. Pregnant women should get a dose of Tdap during every pregnancy, to protect the newborn from pertussis. Infants are most at risk for  severe, life-threatening complications from pertussis. A similar vaccine, called Td, protects from tetanus and diphtheria, but not pertussis. A Td booster should be given every 10 years. Tdap may be given as one of these boosters if you have not already gotten a dose. Tdap may also be given after a severe cut or burn to prevent tetanus infection. Your doctor can give you more information. Tdap may safely be given at the same time as other vaccines. 3. Some people should not get this vaccine  If you ever had a life-threatening allergic reaction after a dose of any tetanus, diphtheria, or pertussis containing vaccine, OR if you have a severe allergy to any part of this vaccine, you should not get Tdap. Tell your doctor if you have any severe allergies.  If you had a coma, or long or multiple seizures within 7 days after a childhood dose of DTP or DTaP, you should not get Tdap, unless a cause other than the vaccine was found. You can still get Td.  Talk to your doctor if you:  have epilepsy or another nervous system problem,  had severe pain or swelling after any vaccine containing diphtheria, tetanus or pertussis,  ever had Guillain-Barr Syndrome (GBS),  aren't feeling well on the day the shot is scheduled. 4. Risks of a vaccine reaction With any medicine, including vaccines, there is a chance of side effects. These are usually mild and go away on their own, but serious reactions are also possible. Brief fainting spells can follow a vaccination, leading to injuries from falling. Sitting or lying down for about 15 minutes can help prevent these. Tell your  doctor if you feel dizzy or light-headed, or have vision changes or ringing in the ears. Mild problems following Tdap (Did not interfere with activities)  Pain where the shot was given (about 3 in 4 adolescents or 2 in 3 adults)  Redness or swelling where the shot was given (about 1 person in 5)  Mild fever of at least 100.82F (up to  about 1 in 25 adolescents or 1 in 100 adults)  Headache (about 3 or 4 people in 10)  Tiredness (about 1 person in 3 or 4)  Nausea, vomiting, diarrhea, stomach ache (up to 1 in 4 adolescents or 1 in 10 adults)  Chills, body aches, sore joints, rash, swollen glands (uncommon) Moderate problems following Tdap (Interfered with activities, but did not require medical attention)  Pain where the shot was given (about 1 in 5 adolescents or 1 in 100 adults)  Redness or swelling where the shot was given (up to about 1 in 16 adolescents or 1 in 25 adults)  Fever over 102F (about 1 in 100 adolescents or 1 in 250 adults)  Headache (about 3 in 20 adolescents or 1 in 10 adults)  Nausea, vomiting, diarrhea, stomach ache (up to 1 or 3 people in 100)  Swelling of the entire arm where the shot was given (up to about 3 in 100). Severe problems following Tdap (Unable to perform usual activities; required medical attention)  Swelling, severe pain, bleeding and redness in the arm where the shot was given (rare). A severe allergic reaction could occur after any vaccine (estimated less than 1 in a million doses). 5. What if there is a serious reaction? What should I look for?  Look for anything that concerns you, such as signs of a severe allergic reaction, very high fever, or behavior changes. Signs of a severe allergic reaction can include hives, swelling of the face and throat, difficulty breathing, a fast heartbeat, dizziness, and weakness. These would start a few minutes to a few hours after the vaccination. What should I do?  If you think it is a severe allergic reaction or other emergency that can't wait, call 9-1-1 or get the person to the nearest hospital. Otherwise, call your doctor.  Afterward, the reaction should be reported to the "Vaccine Adverse Event Reporting System" (VAERS). Your doctor might file this report, or you can do it yourself through the VAERS web site at www.vaers.LAgents.nohhs.gov,  or by calling 1-608 202 9076. VAERS is only for reporting reactions. They do not give medical advice.  6. The National Vaccine Injury Compensation Program The [employer] Vaccine Injury Compensation Program (VICP) is a federal program that was created to compensate people who may have been injured by certain vaccines. Persons who believe they may have been injured by a vaccine can learn about the program and about filing a claim by calling 1-860 008 2617 or visiting the VICP website at SpiritualWord.atwww.hrsa.gov/vaccinecompensation. 7. How can I learn more?  Ask your doctor.  Call your local or state health department.  Contact the Centers for Disease Control and Prevention (CDC):  Call (231)832-79511-319-834-5363 or visit CDC's website at PicCapture.uywww.cdc.gov/vaccines. CDC Tdap Vaccine VIS (12/16/11) Document Released: 01/25/2012 Document Revised: 12/10/2013 Document Reviewed: 11/07/2013 ExitCare Patient Information 2015 [employer]. This information is not intended to replace advice given to you by your health care provider. Make sure you discuss any questions you have with your health care provider.

## 2014-05-13 NOTE — Progress Notes (Signed)
TDaP. Reviewed 28 week labs. Gets very sick from flu vaccine. Declines. C/O SOB at night worse than w/ previous pregnancies. No fever, cough, URI Sx, wheezing, allergy Sx. No Hx cardiac problems. No dizziness, palpitations or tachycardia. Mild relief when sleeping sitting up. No problems during the day. No Hx asthma. Rec zyrtec, Benadryl, try different sleep positions (never flat on back). Rd flags reviewed. Lungs: Normal rate, no resp distress, CTAB. Heart: RRR, No M/R/G.

## 2014-05-13 NOTE — Addendum Note (Signed)
Addended by: Granville LewisLARK, Irva Loser L on: 05/13/2014 11:43 AM   Modules accepted: Orders

## 2014-05-23 ENCOUNTER — Encounter (HOSPITAL_COMMUNITY): Payer: Self-pay

## 2014-05-23 ENCOUNTER — Inpatient Hospital Stay (HOSPITAL_COMMUNITY)
Admission: AD | Admit: 2014-05-23 | Discharge: 2014-05-24 | Disposition: A | Discharge status: Home / Self Care | Payer: Federal, State, Local not specified - PPO | Source: Ambulatory Visit | Attending: Obstetrics & Gynecology | Admitting: Obstetrics & Gynecology

## 2014-05-23 DIAGNOSIS — O4703 False labor before 37 completed weeks of gestation, third trimester: Secondary | ICD-10-CM | POA: Diagnosis not present

## 2014-05-23 DIAGNOSIS — Z3A31 31 weeks gestation of pregnancy: Secondary | ICD-10-CM | POA: Diagnosis not present

## 2014-05-23 DIAGNOSIS — Z3491 Encounter for supervision of normal pregnancy, unspecified, first trimester: Secondary | ICD-10-CM

## 2014-05-23 DIAGNOSIS — Z283 Underimmunization status: Secondary | ICD-10-CM

## 2014-05-23 DIAGNOSIS — O09293 Supervision of pregnancy with other poor reproductive or obstetric history, third trimester: Secondary | ICD-10-CM

## 2014-05-23 DIAGNOSIS — O09899 Supervision of other high risk pregnancies, unspecified trimester: Secondary | ICD-10-CM

## 2014-05-23 DIAGNOSIS — O9989 Other specified diseases and conditions complicating pregnancy, childbirth and the puerperium: Secondary | ICD-10-CM

## 2014-05-23 DIAGNOSIS — Z2839 Other underimmunization status: Secondary | ICD-10-CM

## 2014-05-23 LAB — URINALYSIS, ROUTINE W REFLEX MICROSCOPIC
BILIRUBIN URINE: NEGATIVE
Glucose, UA: NEGATIVE mg/dL
Hgb urine dipstick: NEGATIVE
KETONES UR: NEGATIVE mg/dL
Leukocytes, UA: NEGATIVE
NITRITE: NEGATIVE
Protein, ur: NEGATIVE mg/dL
Specific Gravity, Urine: <1.005 — ABNORMAL LOW (ref 1.005–1.030)
UROBILINOGEN UA: 0.2 mg/dL (ref 0.0–1.0)
pH: 7 (ref 5.0–8.0)

## 2014-05-23 LAB — FETAL FIBRONECTIN: Fetal Fibronectin: NEGATIVE

## 2014-05-23 MED ORDER — TERBUTALINE SULFATE 1 MG/ML IJ SOLN
0.2500 mg | Freq: Once | INTRAMUSCULAR | Status: AC
  Administered 2014-05-23: 0.25 mg via SUBCUTANEOUS

## 2014-05-23 MED ORDER — TERBUTALINE SULFATE 1 MG/ML IJ SOLN
INTRAMUSCULAR | Status: AC
  Filled 2014-05-23: qty 1

## 2014-05-23 MED ORDER — NIFEDIPINE 10 MG PO CAPS
20.0000 mg | ORAL_CAPSULE | Freq: Once | ORAL | Status: AC
  Administered 2014-05-23: 20 mg via ORAL
  Filled 2014-05-23: qty 2

## 2014-05-23 NOTE — MAU Provider Note (Signed)
First Provider Initiated Contact with Patient 05/23/14 2247      Chief Complaint:  Contractions   Alexandra Trujillo is  [age] y.o. [id number] at 10066w0d presents complaining of Contractions .  She states regular, every 2-3 minutes contractions which started 2 hours ago and are associated with none vaginal bleeding, intact membranes, along with active fetal movement. Although her last baby was born full term, she did take procardia for a few months because of preterm contractions.  Obstetrical/Gynecological History: OB History   Grav Para Term Preterm Abortions TAB SAB Ect Mult Living   4 3 3       3      Past Medical History: Past Medical History  Diagnosis Date  . Medical history non-contributory     Past Surgical History: Past Surgical History  Procedure Laterality Date  . Iud removal  2012    malposition  . Laparoscopy      to find IUD & remove it    Family History: Family History  Problem Relation Age of Onset  . Thyroid disease Maternal Aunt     Social History: History  Substance Use Topics  . Smoking status: Never Smoker   . Smokeless tobacco: Never Used  . Alcohol Use: No     Comment: Not since pregnancy    Allergies:  Allergies  Allergen Reactions  . Peanut-Containing Drug Products Other (See Comments)    Headache, sores in mouth  . Tree Extract Itching    Meds:  Prescriptions prior to admission  Medication Sig Dispense Refill  . calcium carbonate (OS-CAL) 1250 MG chewable tablet Chew 1 tablet by mouth daily.      . IRON PO Take 1 tablet by mouth daily.      . Omega-3 Fatty Acids (FISH OIL PO) Take 1 capsule by mouth daily.      . Prenatal Vit-Fe Fumarate-FA (MULTIVITAMIN-PRENATAL) 27-0.8 MG TABS tablet Take 1 tablet by mouth daily at 12 noon.        Review of Systems   Constitutional: Negative for fever and chills Eyes: Negative for visual disturbances Respiratory: Negative for shortness of breath, dyspnea Cardiovascular: Negative for chest pain or  palpitations  Gastrointestinal: Negative for vomiting, diarrhea and constipation Genitourinary: Negative for dysuria and urgency Musculoskeletal: Negative for back pain, joint pain, myalgias  Neurological: Negative for dizziness and headaches     Physical Exam  Blood pressure 131/63, pulse 87, temperature 98.6 F (37 C), temperature source Oral, resp. rate 18, height 4\' 10"  (1.473 m), weight 58.968 kg (130 lb), last menstrual period 10/19/2013, SpO2 100.00%. GENERAL: Well-developed, well-nourished female in no acute distress.  LUNGS: Clear to auscultation bilaterally.  HEART: Regular rate and rhythm. ABDOMEN: Soft, nontender, nondistended, gravid.  EXTREMITIES: Nontender, no edema, 2+ distal pulses. DTR's 2+ CERVICAL EXAM: Dilatation 0cm   Effacement 0%   Station -3   Presentation: cephalic FHT:  Baseline rate 150 bpm   Variability moderate  Accelerations present   Decelerations none Contractions: Every 2-3 mins   Labs: Results for orders placed during the hospital encounter of 05/23/14 (from the past 24 hour(s))  URINALYSIS, ROUTINE W REFLEX MICROSCOPIC   Collection Time    05/23/14 10:10 PM      Result Value Ref Range   Color, Urine YELLOW  YELLOW   APPearance CLEAR  CLEAR   Specific Gravity, Urine <1.005 (*) 1.005 - 1.030   pH 7.0  5.0 - 8.0   Glucose, UA NEGATIVE  NEGATIVE mg/dL   Hgb urine dipstick NEGATIVE  NEGATIVE   Bilirubin Urine NEGATIVE  NEGATIVE   Ketones, ur NEGATIVE  NEGATIVE mg/dL   Protein, ur NEGATIVE  NEGATIVE mg/dL   Urobilinogen, UA 0.2  0.0 - 1.0 mg/dL   Nitrite NEGATIVE  NEGATIVE   Leukocytes, UA NEGATIVE  NEGATIVE  FETAL FIBRONECTIN   Collection Time    05/23/14 10:44 PM      Result Value Ref Range   Fetal Fibronectin NEGATIVE  NEGATIVE   Imaging Studies:  No results found.  Assessment: Alexandra Trujillo is  23 y.o. [phone] at 2775w0d presents with preterm contractions, negative fFn and LTC cervix.  Plan: Did not respond to procardia, so SQ  terbutaline and IM Morphine sulfate (pt preferred injection over PO percocet) offered and accepted. 11:44 PM   CRESENZO-DISHMAN,Maleiah Dula 10/15/201511:44 PM   12:47 AM Cx rechecked and there has been no change whatsoever. Contractions have stopped.  Pt given rx for prn procardia,  Preterm labor precautions also given.  12:48 AM CRESENZO-DISHMAN,Jamesa Tedrick

## 2014-05-23 NOTE — MAU Note (Signed)
Ctx every 2-3 minutes x 1.5 hr. Denies vaginal bleeding or LOF. Positive fetal movement.

## 2014-05-24 MED ORDER — NIFEDIPINE 10 MG PO CAPS: 20.0000 mg | ORAL_CAPSULE | Freq: Four times a day (QID) | ORAL | Status: DC | PRN

## 2014-05-24 MED ORDER — MORPHINE SULFATE 4 MG/ML IJ SOLN
4.0000 mg | Freq: Once | INTRAMUSCULAR | Status: AC
  Administered 2014-05-24: 4 mg via INTRAMUSCULAR
  Filled 2014-05-24: qty 1

## 2014-05-24 NOTE — Discharge Instructions (Signed)
Braxton Hicks Contractions °Contractions of the uterus can occur throughout pregnancy. Contractions are not always a sign that you are in labor.  °WHAT ARE BRAXTON HICKS CONTRACTIONS?  °Contractions that occur before labor are called Braxton Hicks contractions, or false labor. Toward the end of pregnancy (32-34 weeks), these contractions can develop more often and may become more forceful. This is not true labor because these contractions do not result in opening (dilatation) and thinning of the cervix. They are sometimes difficult to tell apart from true labor because these contractions can be forceful and people have different pain tolerances. You should not feel embarrassed if you go to the hospital with false labor. Sometimes, the only way to tell if you are in true labor is for your health care provider to look for changes in the cervix. °If there are no prenatal problems or other health problems associated with the pregnancy, it is completely safe to be sent home with false labor and await the onset of true labor. °HOW CAN YOU TELL THE DIFFERENCE BETWEEN TRUE AND FALSE LABOR? °False Labor °· The contractions of false labor are usually shorter and not as hard as those of true labor.   °· The contractions are usually irregular.   °· The contractions are often felt in the front of the lower abdomen and in the groin.   °· The contractions may go away when you walk around or change positions while lying down.   °· The contractions get weaker and are shorter lasting as time goes on.   °· The contractions do not usually become progressively stronger, regular, and closer together as with true labor.   °True Labor °· Contractions in true labor last 30-70 seconds, become very regular, usually become more intense, and increase in frequency.   °· The contractions do not go away with walking.   °· The discomfort is usually felt in the top of the uterus and spreads to the lower abdomen and low back.   °· True labor can be  determined by your health care provider with an exam. This will show that the cervix is dilating and getting thinner.   °WHAT TO REMEMBER °· Keep up with your usual exercises and follow other instructions given by your health care provider.   °· Take medicines as directed by your health care provider.   °· Keep your regular prenatal appointments.   °· Eat and drink lightly if you think you are going into labor.   °· If Braxton Hicks contractions are making you uncomfortable:   °¨ Change your position from lying down or resting to walking, or from walking to resting.   °¨ Sit and rest in a tub of warm water.   °¨ Drink 2-3 glasses of water. Dehydration may cause these contractions.   °¨ Do slow and deep breathing several times an hour.   °WHEN SHOULD I SEEK IMMEDIATE MEDICAL CARE? °Seek immediate medical care if: °· Your contractions become stronger, more regular, and closer together.   °· You have fluid leaking or gushing from your vagina.   °· You have a fever.   °· You pass blood-tinged mucus.   °· You have vaginal bleeding.   °· You have continuous abdominal pain.   °· You have low back pain that you never had before.   °· You feel your baby's head pushing down and causing pelvic pressure.   °· Your baby is not moving as much as it used to.   °Document Released: 07/26/2005 Document Revised: 07/31/2013 Document Reviewed: 05/07/2013 °ExitCare® Patient Information ©2015 ExitCare, LLC. This information is not intended to replace advice given to you by your health care   provider. Make sure you discuss any questions you have with your health care provider. ° °

## 2014-05-27 ENCOUNTER — Ambulatory Visit (INDEPENDENT_AMBULATORY_CARE_PROVIDER_SITE_OTHER): Payer: Federal, State, Local not specified - PPO | Admitting: Advanced Practice Midwife

## 2014-05-27 VITALS — BP 127/75 | HR 102 | Wt 130.0 lb

## 2014-05-27 DIAGNOSIS — Z3491 Encounter for supervision of normal pregnancy, unspecified, first trimester: Secondary | ICD-10-CM

## 2014-05-27 DIAGNOSIS — Z3481 Encounter for supervision of other normal pregnancy, first trimester: Secondary | ICD-10-CM

## 2014-05-27 NOTE — Progress Notes (Signed)
Seen in MAU Thursday night for contractions

## 2014-05-27 NOTE — Patient Instructions (Signed)

## 2014-05-27 NOTE — Progress Notes (Signed)
See in MAU fro PT contractions. Cervix closed and long. Neg fFN. UC's decreased.

## 2014-05-28 NOTE — MAU Provider Note (Signed)
Attestation of Attending Supervision of Advanced Practitioner (PA/CNM/NP): Evaluation and management procedures were performed by the Advanced Practitioner under my supervision and collaboration.  I have reviewed the Advanced Practitioner's note and chart, and I agree with the management and plan.  Shakevia Sarris, MD, FACOG Attending Obstetrician & Gynecologist Faculty Practice, [facility]   

## 2014-06-03 ENCOUNTER — Ambulatory Visit (INDEPENDENT_AMBULATORY_CARE_PROVIDER_SITE_OTHER): Payer: Federal, State, Local not specified - PPO | Admitting: Advanced Practice Midwife

## 2014-06-03 VITALS — BP 116/65 | HR 81 | Temp 96.4°F | Wt 130.0 lb

## 2014-06-03 DIAGNOSIS — J0100 Acute maxillary sinusitis, unspecified: Secondary | ICD-10-CM

## 2014-06-03 MED ORDER — AZITHROMYCIN 250 MG PO TABS: ORAL_TABLET | ORAL | Status: DC

## 2014-06-03 NOTE — Patient Instructions (Signed)

## 2014-06-03 NOTE — Progress Notes (Signed)
Cold and sinus pain

## 2014-06-03 NOTE — Progress Notes (Signed)
Has had sinus congestion and pain x 2 weeks. No fever but maxillary pressure and pain persists. Lungs CTAB, no wheezes or crackles. HR RRR.  Will treat with Zpack and may need longer therapy if not resolved.

## 2014-06-06 ENCOUNTER — Encounter (HOSPITAL_COMMUNITY): Payer: Self-pay | Admitting: *Deleted

## 2014-06-06 ENCOUNTER — Inpatient Hospital Stay (HOSPITAL_COMMUNITY)
Admission: AD | Admit: 2014-06-06 | Discharge: 2014-06-06 | Disposition: A | Discharge status: Home / Self Care | Payer: Federal, State, Local not specified - PPO | Source: Ambulatory Visit | Attending: Obstetrics & Gynecology | Admitting: Obstetrics & Gynecology

## 2014-06-06 DIAGNOSIS — Z3A33 33 weeks gestation of pregnancy: Secondary | ICD-10-CM | POA: Insufficient documentation

## 2014-06-06 DIAGNOSIS — O4703 False labor before 37 completed weeks of gestation, third trimester: Secondary | ICD-10-CM | POA: Diagnosis not present

## 2014-06-06 LAB — WET PREP, GENITAL
Clue Cells Wet Prep HPF POC: NONE SEEN
Trich, Wet Prep: NONE SEEN
YEAST WET PREP: NONE SEEN

## 2014-06-06 LAB — URINALYSIS, ROUTINE W REFLEX MICROSCOPIC
BILIRUBIN URINE: NEGATIVE
GLUCOSE, UA: NEGATIVE mg/dL
Hgb urine dipstick: NEGATIVE
KETONES UR: 15 mg/dL — AB
Nitrite: NEGATIVE
Protein, ur: NEGATIVE mg/dL
Specific Gravity, Urine: 1.015 (ref 1.005–1.030)
Urobilinogen, UA: 0.2 mg/dL (ref 0.0–1.0)
pH: 6.5 (ref 5.0–8.0)

## 2014-06-06 LAB — FETAL FIBRONECTIN: Fetal Fibronectin: NEGATIVE

## 2014-06-06 LAB — URINE MICROSCOPIC-ADD ON

## 2014-06-06 MED ORDER — NIFEDIPINE 20 MG PO CAPS: 20.0000 mg | ORAL_CAPSULE | Freq: Four times a day (QID) | ORAL | Status: DC | PRN

## 2014-06-06 MED ORDER — NIFEDIPINE 10 MG PO CAPS
20.0000 mg | ORAL_CAPSULE | ORAL | Status: AC
  Administered 2014-06-06: 20 mg via ORAL
  Filled 2014-06-06: qty 2

## 2014-06-06 NOTE — Progress Notes (Signed)
Pt states earlier today she was curled up in a ball and could not make it out of the house

## 2014-06-06 NOTE — Discharge Instructions (Signed)

## 2014-06-06 NOTE — MAU Provider Note (Signed)
Chief Complaint:  Contractions   First Provider Initiated Contact with Patient 06/06/14 2107      HPI: Alexandra Trujillo is a 23 y.o. [id number] at 7331w0d [place] who presents to maternity admissions reporting contractions starting around 6 pm, worse when they started, but continuing every 2-3 minutes now.  She has hx of 3 term vaginal deliveries but did take Procardia for contractions with her last pregnancy.  She reports good fetal movement, denies LOF, vaginal bleeding, vaginal itching/burning, urinary symptoms, h/a, dizziness, n/v, or fever/chills.    Past Medical History: Past Medical History  Diagnosis Date  . Medical history non-contributory     Past obstetric history: OB History  Gravida Para Term Preterm AB SAB TAB Ectopic Multiple Living  4 3 3       3     # Outcome Date GA Lbr Len/2nd Weight Sex Delivery Anes PTL Lv  4 CUR           3 TRM 03/07/11 5163w4d  3.402 kg (7 lb 8 oz) M SVD EPI  Y     Comments: No lacerations  2 TRM 11/14/08 1283w0d  3.232 kg (7 lb 2 oz) M SVD EPI  Y     Comments: small lac  1 TRM 09/02/07 2059w0d  3.289 kg (7 lb 4 oz) M SVD EPI  Y     Comments: 4th degree episiotomy      Past Surgical History: Past Surgical History  Procedure Laterality Date  . Iud removal  2012    malposition  . Laparoscopy      to find IUD & remove it    Family History: Family History  Problem Relation Age of Onset  . Thyroid disease Maternal Aunt     Social History: History  Substance Use Topics  . Smoking status: Never Smoker   . Smokeless tobacco: Never Used  . Alcohol Use: No     Comment: Not since pregnancy    Allergies:  Allergies  Allergen Reactions  . Peanut-Containing Drug Products Other (See Comments)    Headache, sores in mouth  . Tree Extract Itching    Meds:  Prescriptions prior to admission  Medication Sig Dispense Refill  . acetaminophen (TYLENOL) 325 MG tablet Take 650 mg by mouth every 6 (six) hours as needed for fever.      Marland Kitchen.  azithromycin (ZITHROMAX Z-PAK) 250 MG tablet 2 tablets Day 1 then one tablet per day  6 each  0  . calcium carbonate (OS-CAL) 1250 MG chewable tablet Chew 1 tablet by mouth daily.      . diphenhydrAMINE (BENADRYL) 25 MG tablet Take 25 mg by mouth every 6 (six) hours as needed.      . Omega-3 Fatty Acids (FISH OIL PO) Take 1 capsule by mouth daily.      . Prenatal Vit-Fe Fumarate-FA (MULTIVITAMIN-PRENATAL) 27-0.8 MG TABS tablet Take 1 tablet by mouth daily at 12 noon.        ROS: Pertinent findings in history of present illness.  Physical Exam  Blood pressure 118/51, pulse 110, temperature 97.8 F (36.6 C), temperature source Oral, resp. rate 18, last menstrual period 10/19/2013. GENERAL: Well-developed, well-nourished female in no acute distress.  HEENT: normocephalic HEART: normal rate RESP: normal effort ABDOMEN: Soft, non-tender, gravid appropriate for gestational age EXTREMITIES: Nontender, no edema NEURO: alert and oriented SPECULUM EXAM: NEFG, physiologic discharge, no blood, cervix clean   Cervix 0/thick/high, posterior, firm  FHT:  Baseline 135, moderate variability, accelerations present, no decelerations  Contractions: q 2-3 mins, mild to moderate to palpation   Labs: Results for orders placed during the hospital encounter of 06/06/14 (from the past 24 hour(s))  URINALYSIS, ROUTINE W REFLEX MICROSCOPIC     Status: Abnormal   Collection Time    06/06/14  8:25 PM      Result Value Ref Range   Color, Urine YELLOW  YELLOW   APPearance HAZY (*) CLEAR   Specific Gravity, Urine 1.015  1.005 - 1.030   pH 6.5  5.0 - 8.0   Glucose, UA NEGATIVE  NEGATIVE mg/dL   Hgb urine dipstick NEGATIVE  NEGATIVE   Bilirubin Urine NEGATIVE  NEGATIVE   Ketones, ur 15 (*) NEGATIVE mg/dL   Protein, ur NEGATIVE  NEGATIVE mg/dL   Urobilinogen, UA 0.2  0.0 - 1.0 mg/dL   Nitrite NEGATIVE  NEGATIVE   Leukocytes, UA SMALL (*) NEGATIVE  URINE MICROSCOPIC-ADD ON     Status: Abnormal   Collection  Time    06/06/14  8:25 PM      Result Value Ref Range   Squamous Epithelial / LPF FEW (*) RARE   WBC, UA 3-6  <3 WBC/hpf   Bacteria, UA FEW (*) RARE   Urine-Other MUCOUS PRESENT    WET PREP, GENITAL     Status: Abnormal   Collection Time    06/06/14  9:20 PM      Result Value Ref Range   Yeast Wet Prep HPF POC NONE SEEN  NONE SEEN   Trich, Wet Prep NONE SEEN  NONE SEEN   Clue Cells Wet Prep HPF POC NONE SEEN  NONE SEEN   WBC, Wet Prep HPF POC FEW (*) NONE SEEN  FETAL FIBRONECTIN     Status: None   Collection Time    06/06/14  9:20 PM      Result Value Ref Range   Fetal Fibronectin NEGATIVE  NEGATIVE   ED Course FFN, wet prep, urine culture sent Procardia 20 mg x 1 dose in MAU Pt reports improvement of symptoms and no contractions on toco 30-45 minutes after Procardia  Assessment: 1. Preterm contractions, third trimester     Plan: Discharge home PTL precautions and fetal kick counts Increase PO fluids      Follow-up Information   Follow up with [facility] [place]. (As scheduled)    Specialty:  Obstetrics and Gynecology   Contact information:   [address] [place] [address] [phone]      Follow up with [facility] [place] [facility]. (As needed for emergencies)    Contact information:   [address] [id number] [place] [address] [phone]       Medication List         acetaminophen 325 MG tablet  Commonly known as:  TYLENOL  Take 650 mg by mouth every 6 (six) hours as needed for fever.     azithromycin 250 MG tablet  Commonly known as:  ZITHROMAX Z-PAK  2 tablets Day 1 then one tablet per day     calcium carbonate 1250 MG chewable tablet  Commonly known as:  OS-CAL  Chew 1 tablet by mouth daily.     diphenhydrAMINE 25 MG tablet  Commonly known as:  BENADRYL  Take 25 mg by mouth every 6 (six) hours as needed.     FISH OIL PO  Take 1 capsule by mouth  daily.     multivitamin-prenatal 27-0.8 MG Tabs tablet  Take  1 tablet by mouth daily at 12 noon.     NIFEdipine 20 MG capsule  Commonly known as:  PROCARDIA  Take 1 capsule (20 mg total) by mouth every 6 (six) hours as needed.        Sharen CounterLisa Leftwich-Kirby Certified Nurse-Midwife 06/06/2014 11:12 PM

## 2014-06-06 NOTE — Progress Notes (Signed)
Pt removed from EFM by L Leftwich-Kirby CNM

## 2014-06-06 NOTE — MAU Note (Signed)
Contractions started today about 1800-1830. Pt states she has been having contractions 2wks ago

## 2014-06-07 NOTE — MAU Provider Note (Signed)
Attestation of Attending Supervision of Advanced Practitioner (CNM/NP): Evaluation and management procedures were performed by the Advanced Practitioner under my supervision and collaboration.  I have reviewed the Advanced Practitioner's note and chart, and I agree with the management and plan.  HARRAWAY-SMITH, Chontel Warning 12:36 AM

## 2014-06-08 LAB — URINE CULTURE
COLONY COUNT: NO GROWTH
CULTURE: NO GROWTH

## 2014-06-10 ENCOUNTER — Ambulatory Visit (INDEPENDENT_AMBULATORY_CARE_PROVIDER_SITE_OTHER): Payer: Federal, State, Local not specified - PPO | Admitting: Advanced Practice Midwife

## 2014-06-10 ENCOUNTER — Encounter (HOSPITAL_COMMUNITY): Payer: Self-pay | Admitting: *Deleted

## 2014-06-10 VITALS — BP 114/73 | HR 118 | Wt 132.0 lb

## 2014-06-10 DIAGNOSIS — Z3483 Encounter for supervision of other normal pregnancy, third trimester: Secondary | ICD-10-CM

## 2014-06-10 NOTE — Patient Instructions (Signed)
Third Trimester of Pregnancy The third trimester is from week 29 through week 42, months 7 through 9. The third trimester is a time when the fetus is growing rapidly. At the end of the ninth month, the fetus is about 20 inches in length and weighs 6-10 pounds.  BODY CHANGES Your body goes through many changes during pregnancy. The changes vary from woman to woman.   Your weight will continue to increase. You can expect to gain 25-35 pounds (11-16 kg) by the end of the pregnancy.  You may begin to get stretch marks on your hips, abdomen, and breasts.  You may urinate more often because the fetus is moving lower into your pelvis and pressing on your bladder.  You may develop or continue to have heartburn as a result of your pregnancy.  You may develop constipation because certain hormones are causing the muscles that push waste through your intestines to slow down.  You may develop hemorrhoids or swollen, bulging veins (varicose veins).  You may have pelvic pain because of the weight gain and pregnancy hormones relaxing your joints between the bones in your pelvis. Backaches may result from overexertion of the muscles supporting your posture.  You may have changes in your hair. These can include thickening of your hair, rapid growth, and changes in texture. Some women also have hair loss during or after pregnancy, or hair that feels dry or thin. Your hair will most likely return to normal after your baby is born.  Your breasts will continue to grow and be tender. A yellow discharge may leak from your breasts called colostrum.  Your belly button may stick out.  You may feel short of breath because of your expanding uterus.  You may notice the fetus "dropping," or moving lower in your abdomen.  You may have a bloody mucus discharge. This usually occurs a few days to a week before labor begins.  Your cervix becomes thin and soft (effaced) near your due date. WHAT TO EXPECT AT YOUR PRENATAL  EXAMS  You will have prenatal exams every 2 weeks until week 36. Then, you will have weekly prenatal exams. During a routine prenatal visit:  You will be weighed to make sure you and the fetus are growing normally.  Your blood pressure is taken.  Your abdomen will be measured to track your baby's growth.  The fetal heartbeat will be listened to.  Any test results from the previous visit will be discussed.  You may have a cervical check near your due date to see if you have effaced. At around 36 weeks, your caregiver will check your cervix. At the same time, your caregiver will also perform a test on the secretions of the vaginal tissue. This test is to determine if a type of bacteria, Group B streptococcus, is present. Your caregiver will explain this further. Your caregiver may ask you:  What your birth plan is.  How you are feeling.  If you are feeling the baby move.  If you have had any abnormal symptoms, such as leaking fluid, bleeding, severe headaches, or abdominal cramping.  If you have any questions. Other tests or screenings that may be performed during your third trimester include:  Blood tests that check for low iron levels (anemia).  Fetal testing to check the health, activity level, and growth of the fetus. Testing is done if you have certain medical conditions or if there are problems during the pregnancy. FALSE LABOR You may feel small, irregular contractions that   eventually go away. These are called Braxton Hicks contractions, or false labor. Contractions may last for hours, days, or even weeks before true labor sets in. If contractions come at regular intervals, intensify, or become painful, it is best to be seen by your caregiver.  SIGNS OF LABOR   Menstrual-like cramps.  Contractions that are 5 minutes apart or less.  Contractions that start on the top of the uterus and spread down to the lower abdomen and back.  A sense of increased pelvic pressure or back  pain.  A watery or bloody mucus discharge that comes from the vagina. If you have any of these signs before the 37th week of pregnancy, call your caregiver right away. You need to go to the hospital to get checked immediately. HOME CARE INSTRUCTIONS   Avoid all smoking, herbs, alcohol, and unprescribed drugs. These chemicals affect the formation and growth of the baby.  Follow your caregiver's instructions regarding medicine use. There are medicines that are either safe or unsafe to take during pregnancy.  Exercise only as directed by your caregiver. Experiencing uterine cramps is a good sign to stop exercising.  Continue to eat regular, healthy meals.  Wear a good support bra for breast tenderness.  Do not use hot tubs, steam rooms, or saunas.  Wear your seat belt at all times when driving.  Avoid raw meat, uncooked cheese, cat litter boxes, and soil used by cats. These carry germs that can cause birth defects in the baby.  Take your prenatal vitamins.  Try taking a stool softener (if your caregiver approves) if you develop constipation. Eat more high-fiber foods, such as fresh vegetables or fruit and whole grains. Drink plenty of fluids to keep your urine clear or pale yellow.  Take warm sitz baths to soothe any pain or discomfort caused by hemorrhoids. Use hemorrhoid cream if your caregiver approves.  If you develop varicose veins, wear support hose. Elevate your feet for 15 minutes, 3-4 times a day. Limit salt in your diet.  Avoid heavy lifting, wear low heal shoes, and practice good posture.  Rest a lot with your legs elevated if you have leg cramps or low back pain.  Visit your dentist if you have not gone during your pregnancy. Use a soft toothbrush to brush your teeth and be gentle when you floss.  A sexual relationship may be continued unless your caregiver directs you otherwise.  Do not travel far distances unless it is absolutely necessary and only with the approval  of your caregiver.  Take prenatal classes to understand, practice, and ask questions about the labor and delivery.  Make a trial run to the hospital.  Pack your hospital bag.  Prepare the baby's nursery.  Continue to go to all your prenatal visits as directed by your caregiver. SEEK MEDICAL CARE IF:  You are unsure if you are in labor or if your water has broken.  You have dizziness.  You have mild pelvic cramps, pelvic pressure, or nagging pain in your abdominal area.  You have persistent nausea, vomiting, or diarrhea.  You have a bad smelling vaginal discharge.  You have pain with urination. SEEK IMMEDIATE MEDICAL CARE IF:   You have a fever.  You are leaking fluid from your vagina.  You have spotting or bleeding from your vagina.  You have severe abdominal cramping or pain.  You have rapid weight loss or gain.  You have shortness of breath with chest pain.  You notice sudden or extreme swelling   of your face, hands, ankles, feet, or legs.  You have not felt your baby move in over an hour.  You have severe headaches that do not go away with medicine.  You have vision changes. Document Released: 07/20/2001 Document Revised: 07/31/2013 Document Reviewed: 09/26/2012 ExitCare Patient Information 2015 [employer]. This information is not intended to replace advice given to you by your health care provider. Make sure you discuss any questions you have with your health care provider.  

## 2014-06-10 NOTE — Progress Notes (Signed)
Doing well, still some allergies. Will start daily Claritin

## 2014-06-24 ENCOUNTER — Ambulatory Visit (INDEPENDENT_AMBULATORY_CARE_PROVIDER_SITE_OTHER): Payer: Federal, State, Local not specified - PPO | Admitting: Advanced Practice Midwife

## 2014-06-24 VITALS — BP 113/58 | HR 109 | Wt 134.0 lb

## 2014-06-24 DIAGNOSIS — Z3483 Encounter for supervision of other normal pregnancy, third trimester: Secondary | ICD-10-CM

## 2014-06-24 NOTE — Progress Notes (Signed)
Doing well. Wants to wait until next week for cultures. Reviewed hospital location and labor signs

## 2014-06-24 NOTE — Patient Instructions (Signed)
Third Trimester of Pregnancy The third trimester is from week 29 through week 42, months 7 through 9. The third trimester is a time when the fetus is growing rapidly. At the end of the ninth month, the fetus is about 20 inches in length and weighs 6-10 pounds.  BODY CHANGES Your body goes through many changes during pregnancy. The changes vary from woman to woman.   Your weight will continue to increase. You can expect to gain 25-35 pounds (11-16 kg) by the end of the pregnancy.  You may begin to get stretch marks on your hips, abdomen, and breasts.  You may urinate more often because the fetus is moving lower into your pelvis and pressing on your bladder.  You may develop or continue to have heartburn as a result of your pregnancy.  You may develop constipation because certain hormones are causing the muscles that push waste through your intestines to slow down.  You may develop hemorrhoids or swollen, bulging veins (varicose veins).  You may have pelvic pain because of the weight gain and pregnancy hormones relaxing your joints between the bones in your pelvis. Backaches may result from overexertion of the muscles supporting your posture.  You may have changes in your hair. These can include thickening of your hair, rapid growth, and changes in texture. Some women also have hair loss during or after pregnancy, or hair that feels dry or thin. Your hair will most likely return to normal after your baby is born.  Your breasts will continue to grow and be tender. A yellow discharge may leak from your breasts called colostrum.  Your belly button may stick out.  You may feel short of breath because of your expanding uterus.  You may notice the fetus "dropping," or moving lower in your abdomen.  You may have a bloody mucus discharge. This usually occurs a few days to a week before labor begins.  Your cervix becomes thin and soft (effaced) near your due date. WHAT TO EXPECT AT YOUR PRENATAL  EXAMS  You will have prenatal exams every 2 weeks until week 36. Then, you will have weekly prenatal exams. During a routine prenatal visit:  You will be weighed to make sure you and the fetus are growing normally.  Your blood pressure is taken.  Your abdomen will be measured to track your baby's growth.  The fetal heartbeat will be listened to.  Any test results from the previous visit will be discussed.  You may have a cervical check near your due date to see if you have effaced. At around 36 weeks, your caregiver will check your cervix. At the same time, your caregiver will also perform a test on the secretions of the vaginal tissue. This test is to determine if a type of bacteria, Group B streptococcus, is present. Your caregiver will explain this further. Your caregiver may ask you:  What your birth plan is.  How you are feeling.  If you are feeling the baby move.  If you have had any abnormal symptoms, such as leaking fluid, bleeding, severe headaches, or abdominal cramping.  If you have any questions. Other tests or screenings that may be performed during your third trimester include:  Blood tests that check for low iron levels (anemia).  Fetal testing to check the health, activity level, and growth of the fetus. Testing is done if you have certain medical conditions or if there are problems during the pregnancy. FALSE LABOR You may feel small, irregular contractions that   eventually go away. These are called Braxton Hicks contractions, or false labor. Contractions may last for hours, days, or even weeks before true labor sets in. If contractions come at regular intervals, intensify, or become painful, it is best to be seen by your caregiver.  SIGNS OF LABOR   Menstrual-like cramps.  Contractions that are 5 minutes apart or less.  Contractions that start on the top of the uterus and spread down to the lower abdomen and back.  A sense of increased pelvic pressure or back  pain.  A watery or bloody mucus discharge that comes from the vagina. If you have any of these signs before the 37th week of pregnancy, call your caregiver right away. You need to go to the hospital to get checked immediately. HOME CARE INSTRUCTIONS   Avoid all smoking, herbs, alcohol, and unprescribed drugs. These chemicals affect the formation and growth of the baby.  Follow your caregiver's instructions regarding medicine use. There are medicines that are either safe or unsafe to take during pregnancy.  Exercise only as directed by your caregiver. Experiencing uterine cramps is a good sign to stop exercising.  Continue to eat regular, healthy meals.  Wear a good support bra for breast tenderness.  Do not use hot tubs, steam rooms, or saunas.  Wear your seat belt at all times when driving.  Avoid raw meat, uncooked cheese, cat litter boxes, and soil used by cats. These carry germs that can cause birth defects in the baby.  Take your prenatal vitamins.  Try taking a stool softener (if your caregiver approves) if you develop constipation. Eat more high-fiber foods, such as fresh vegetables or fruit and whole grains. Drink plenty of fluids to keep your urine clear or pale yellow.  Take warm sitz baths to soothe any pain or discomfort caused by hemorrhoids. Use hemorrhoid cream if your caregiver approves.  If you develop varicose veins, wear support hose. Elevate your feet for 15 minutes, 3-4 times a day. Limit salt in your diet.  Avoid heavy lifting, wear low heal shoes, and practice good posture.  Rest a lot with your legs elevated if you have leg cramps or low back pain.  Visit your dentist if you have not gone during your pregnancy. Use a soft toothbrush to brush your teeth and be gentle when you floss.  A sexual relationship may be continued unless your caregiver directs you otherwise.  Do not travel far distances unless it is absolutely necessary and only with the approval  of your caregiver.  Take prenatal classes to understand, practice, and ask questions about the labor and delivery.  Make a trial run to the hospital.  Pack your hospital bag.  Prepare the baby's nursery.  Continue to go to all your prenatal visits as directed by your caregiver. SEEK MEDICAL CARE IF:  You are unsure if you are in labor or if your water has broken.  You have dizziness.  You have mild pelvic cramps, pelvic pressure, or nagging pain in your abdominal area.  You have persistent nausea, vomiting, or diarrhea.  You have a bad smelling vaginal discharge.  You have pain with urination. SEEK IMMEDIATE MEDICAL CARE IF:   You have a fever.  You are leaking fluid from your vagina.  You have spotting or bleeding from your vagina.  You have severe abdominal cramping or pain.  You have rapid weight loss or gain.  You have shortness of breath with chest pain.  You notice sudden or extreme swelling   of your face, hands, ankles, feet, or legs.  You have not felt your baby move in over an hour.  You have severe headaches that do not go away with medicine.  You have vision changes. Document Released: 07/20/2001 Document Revised: 07/31/2013 Document Reviewed: 09/26/2012 ExitCare Patient Information 2015 [employer]. This information is not intended to replace advice given to you by your health care provider. Make sure you discuss any questions you have with your health care provider.  

## 2014-07-01 ENCOUNTER — Encounter: Payer: Self-pay | Admitting: Advanced Practice Midwife

## 2014-07-01 ENCOUNTER — Ambulatory Visit (INDEPENDENT_AMBULATORY_CARE_PROVIDER_SITE_OTHER): Payer: Federal, State, Local not specified - PPO | Admitting: Advanced Practice Midwife

## 2014-07-01 VITALS — BP 122/76 | HR 109 | Wt 134.0 lb

## 2014-07-01 DIAGNOSIS — Z3483 Encounter for supervision of other normal pregnancy, third trimester: Secondary | ICD-10-CM

## 2014-07-01 DIAGNOSIS — Z36 Encounter for antenatal screening of mother: Secondary | ICD-10-CM

## 2014-07-01 LAB — OB RESULTS CONSOLE GBS: GBS: NEGATIVE

## 2014-07-01 LAB — OB RESULTS CONSOLE GC/CHLAMYDIA
Chlamydia: NEGATIVE
Gonorrhea: NEGATIVE

## 2014-07-01 NOTE — Patient Instructions (Signed)

## 2014-07-01 NOTE — Progress Notes (Signed)
Cultures this week. Labor precautions reviewed

## 2014-07-03 LAB — GC/CHLAMYDIA PROBE AMP
CT PROBE, AMP APTIMA: NEGATIVE
GC Probe RNA: NEGATIVE

## 2014-07-04 LAB — CULTURE, BETA STREP (GROUP B ONLY)

## 2014-07-08 ENCOUNTER — Ambulatory Visit (INDEPENDENT_AMBULATORY_CARE_PROVIDER_SITE_OTHER): Payer: Federal, State, Local not specified - PPO | Admitting: Obstetrics & Gynecology

## 2014-07-08 VITALS — BP 101/58 | HR 99 | Wt 134.0 lb

## 2014-07-08 DIAGNOSIS — Z3481 Encounter for supervision of other normal pregnancy, first trimester: Secondary | ICD-10-CM

## 2014-07-08 DIAGNOSIS — Z3491 Encounter for supervision of normal pregnancy, unspecified, first trimester: Secondary | ICD-10-CM

## 2014-07-08 NOTE — Progress Notes (Signed)
No concerns.  Cervix slightly  More dilated.

## 2014-07-15 ENCOUNTER — Encounter: Payer: Self-pay | Admitting: Advanced Practice Midwife

## 2014-07-15 ENCOUNTER — Ambulatory Visit (INDEPENDENT_AMBULATORY_CARE_PROVIDER_SITE_OTHER): Payer: Federal, State, Local not specified - PPO | Admitting: Advanced Practice Midwife

## 2014-07-15 VITALS — BP 116/63 | HR 90 | Wt 133.0 lb

## 2014-07-15 DIAGNOSIS — Z3481 Encounter for supervision of other normal pregnancy, first trimester: Secondary | ICD-10-CM

## 2014-07-15 DIAGNOSIS — Z3491 Encounter for supervision of normal pregnancy, unspecified, first trimester: Secondary | ICD-10-CM

## 2014-07-15 NOTE — Progress Notes (Signed)
Doing well. Labor precautions  

## 2014-07-15 NOTE — Patient Instructions (Signed)

## 2014-07-18 ENCOUNTER — Encounter (HOSPITAL_COMMUNITY): Payer: Self-pay | Admitting: *Deleted

## 2014-07-18 ENCOUNTER — Inpatient Hospital Stay (HOSPITAL_COMMUNITY): Payer: Federal, State, Local not specified - PPO | Admitting: Anesthesiology

## 2014-07-18 ENCOUNTER — Inpatient Hospital Stay (HOSPITAL_COMMUNITY)
Admission: AD | Admit: 2014-07-18 | Discharge: 2014-07-20 | DRG: 775 | Disposition: A | Discharge status: Home / Self Care | Payer: Federal, State, Local not specified - PPO | Source: Ambulatory Visit | Attending: Obstetrics and Gynecology | Admitting: Obstetrics and Gynecology

## 2014-07-18 DIAGNOSIS — Z283 Underimmunization status: Secondary | ICD-10-CM

## 2014-07-18 DIAGNOSIS — O9989 Other specified diseases and conditions complicating pregnancy, childbirth and the puerperium: Secondary | ICD-10-CM

## 2014-07-18 DIAGNOSIS — O479 False labor, unspecified: Secondary | ICD-10-CM | POA: Diagnosis present

## 2014-07-18 DIAGNOSIS — Z3A39 39 weeks gestation of pregnancy: Secondary | ICD-10-CM | POA: Diagnosis present

## 2014-07-18 DIAGNOSIS — O4292 Full-term premature rupture of membranes, unspecified as to length of time between rupture and onset of labor: Secondary | ICD-10-CM

## 2014-07-18 DIAGNOSIS — Z2839 Other underimmunization status: Secondary | ICD-10-CM

## 2014-07-18 DIAGNOSIS — O09293 Supervision of pregnancy with other poor reproductive or obstetric history, third trimester: Secondary | ICD-10-CM

## 2014-07-18 DIAGNOSIS — Z3491 Encounter for supervision of normal pregnancy, unspecified, first trimester: Secondary | ICD-10-CM

## 2014-07-18 LAB — CBC
HEMATOCRIT: 32.7 % — AB (ref 36.0–46.0)
HEMOGLOBIN: 11.4 g/dL — AB (ref 12.0–15.0)
MCH: 32.8 pg (ref 26.0–34.0)
MCHC: 34.9 g/dL (ref 30.0–36.0)
MCV: 94 fL (ref 78.0–100.0)
Platelets: 132 10*3/uL — ABNORMAL LOW (ref 150–400)
RBC: 3.48 MIL/uL — ABNORMAL LOW (ref 3.87–5.11)
RDW: 13.8 % (ref 11.5–15.5)
WBC: 14 10*3/uL — ABNORMAL HIGH (ref 4.0–10.5)

## 2014-07-18 LAB — TYPE AND SCREEN
ABO/RH(D): A POS
Antibody Screen: NEGATIVE

## 2014-07-18 LAB — POCT FERN TEST: POCT FERN TEST: POSITIVE

## 2014-07-18 LAB — RPR

## 2014-07-18 LAB — ABO/RH: ABO/RH(D): A POS

## 2014-07-18 LAB — HIV ANTIBODY (ROUTINE TESTING W REFLEX): HIV 1&2 Ab, 4th Generation: NONREACTIVE

## 2014-07-18 MED ORDER — BENZOCAINE-MENTHOL 20-0.5 % EX AERO: 1.0000 "application " | INHALATION_SPRAY | CUTANEOUS | Status: DC | PRN

## 2014-07-18 MED ORDER — OXYCODONE-ACETAMINOPHEN 5-325 MG PO TABS: 1.0000 | ORAL_TABLET | ORAL | Status: DC | PRN

## 2014-07-18 MED ORDER — IBUPROFEN 600 MG PO TABS
600.0000 mg | ORAL_TABLET | Freq: Four times a day (QID) | ORAL | Status: DC
  Administered 2014-07-18 – 2014-07-20 (×7): 600 mg via ORAL
  Filled 2014-07-18 (×7): qty 1

## 2014-07-18 MED ORDER — FENTANYL 2.5 MCG/ML BUPIVACAINE 1/10 % EPIDURAL INFUSION (WH - ANES)
INTRAMUSCULAR | Status: AC
  Filled 2014-07-18: qty 125

## 2014-07-18 MED ORDER — PRENATAL MULTIVITAMIN CH
1.0000 | ORAL_TABLET | Freq: Every day | ORAL | Status: DC
  Administered 2014-07-19: 1 via ORAL
  Filled 2014-07-18: qty 1

## 2014-07-18 MED ORDER — PHENYLEPHRINE 40 MCG/ML (10ML) SYRINGE FOR IV PUSH (FOR BLOOD PRESSURE SUPPORT)
80.0000 ug | PREFILLED_SYRINGE | INTRAVENOUS | Status: DC | PRN
  Filled 2014-07-18: qty 2

## 2014-07-18 MED ORDER — ACETAMINOPHEN 325 MG PO TABS: 650.0000 mg | ORAL_TABLET | ORAL | Status: DC | PRN

## 2014-07-18 MED ORDER — EPHEDRINE 5 MG/ML INJ
10.0000 mg | INTRAVENOUS | Status: DC | PRN
  Filled 2014-07-18: qty 2

## 2014-07-18 MED ORDER — ONDANSETRON HCL 4 MG/2ML IJ SOLN: 4.0000 mg | Freq: Four times a day (QID) | INTRAMUSCULAR | Status: DC | PRN

## 2014-07-18 MED ORDER — LACTATED RINGERS IV SOLN
INTRAVENOUS | Status: DC
  Administered 2014-07-18 (×2): via INTRAVENOUS

## 2014-07-18 MED ORDER — SENNOSIDES-DOCUSATE SODIUM 8.6-50 MG PO TABS
2.0000 | ORAL_TABLET | ORAL | Status: DC
  Administered 2014-07-19 (×2): 2 via ORAL
  Filled 2014-07-18 (×2): qty 2

## 2014-07-18 MED ORDER — DIPHENHYDRAMINE HCL 50 MG/ML IJ SOLN: 12.5000 mg | INTRAMUSCULAR | Status: DC | PRN

## 2014-07-18 MED ORDER — CITRIC ACID-SODIUM CITRATE 334-500 MG/5ML PO SOLN: 30.0000 mL | ORAL | Status: DC | PRN

## 2014-07-18 MED ORDER — OXYTOCIN 40 UNITS IN LACTATED RINGERS INFUSION - SIMPLE MED
62.5000 mL/h | INTRAVENOUS | Status: DC
  Administered 2014-07-18: 999 mL/h via INTRAVENOUS
  Filled 2014-07-18: qty 1000

## 2014-07-18 MED ORDER — OXYTOCIN BOLUS FROM INFUSION: 500.0000 mL | INTRAVENOUS | Status: DC

## 2014-07-18 MED ORDER — TETANUS-DIPHTH-ACELL PERTUSSIS 5-2.5-18.5 LF-MCG/0.5 IM SUSP: 0.5000 mL | Freq: Once | INTRAMUSCULAR | Status: DC

## 2014-07-18 MED ORDER — BUTORPHANOL TARTRATE 1 MG/ML IJ SOLN
1.0000 mg | INTRAMUSCULAR | Status: DC | PRN
Start: 2014-07-18 — End: 2014-07-18

## 2014-07-18 MED ORDER — DIPHENHYDRAMINE HCL 25 MG PO CAPS: 25.0000 mg | ORAL_CAPSULE | Freq: Four times a day (QID) | ORAL | Status: DC | PRN

## 2014-07-18 MED ORDER — FENTANYL 2.5 MCG/ML BUPIVACAINE 1/10 % EPIDURAL INFUSION (WH - ANES)
14.0000 mL/h | INTRAMUSCULAR | Status: DC | PRN
  Administered 2014-07-18: 14 mL/h via EPIDURAL

## 2014-07-18 MED ORDER — ZOLPIDEM TARTRATE 5 MG PO TABS: 5.0000 mg | ORAL_TABLET | Freq: Every evening | ORAL | Status: DC | PRN

## 2014-07-18 MED ORDER — DIBUCAINE 1 % RE OINT: 1.0000 "application " | TOPICAL_OINTMENT | RECTAL | Status: DC | PRN

## 2014-07-18 MED ORDER — BUPIVACAINE HCL (PF) 0.25 % IJ SOLN
INTRAMUSCULAR | Status: AC
  Filled 2014-07-18: qty 20

## 2014-07-18 MED ORDER — SIMETHICONE 80 MG PO CHEW: 80.0000 mg | CHEWABLE_TABLET | ORAL | Status: DC | PRN

## 2014-07-18 MED ORDER — FLEET ENEMA 7-19 GM/118ML RE ENEM: 1.0000 | ENEMA | RECTAL | Status: DC | PRN

## 2014-07-18 MED ORDER — PHENYLEPHRINE 40 MCG/ML (10ML) SYRINGE FOR IV PUSH (FOR BLOOD PRESSURE SUPPORT)
PREFILLED_SYRINGE | INTRAVENOUS | Status: AC
  Filled 2014-07-18: qty 10

## 2014-07-18 MED ORDER — LIDOCAINE HCL (PF) 1 % IJ SOLN
30.0000 mL | INTRAMUSCULAR | Status: DC | PRN
  Filled 2014-07-18: qty 30

## 2014-07-18 MED ORDER — ONDANSETRON HCL 4 MG/2ML IJ SOLN: 4.0000 mg | INTRAMUSCULAR | Status: DC | PRN

## 2014-07-18 MED ORDER — LACTATED RINGERS IV SOLN
500.0000 mL | Freq: Once | INTRAVENOUS | Status: AC
  Administered 2014-07-18: 500 mL via INTRAVENOUS

## 2014-07-18 MED ORDER — LACTATED RINGERS IV SOLN
500.0000 mL | INTRAVENOUS | Status: DC | PRN
  Administered 2014-07-18: 500 mL via INTRAVENOUS

## 2014-07-18 MED ORDER — LANOLIN HYDROUS EX OINT: TOPICAL_OINTMENT | CUTANEOUS | Status: DC | PRN

## 2014-07-18 MED ORDER — LIDOCAINE HCL (PF) 1 % IJ SOLN
INTRAMUSCULAR | Status: DC | PRN
  Administered 2014-07-18 (×3): 4 mL

## 2014-07-18 MED ORDER — OXYTOCIN 40 UNITS IN LACTATED RINGERS INFUSION - SIMPLE MED
1.0000 m[IU]/min | INTRAVENOUS | Status: DC
  Administered 2014-07-18: 2 m[IU]/min via INTRAVENOUS

## 2014-07-18 MED ORDER — TERBUTALINE SULFATE 1 MG/ML IJ SOLN: 0.2500 mg | Freq: Once | INTRAMUSCULAR | Status: DC | PRN

## 2014-07-18 MED ORDER — ONDANSETRON HCL 4 MG PO TABS: 4.0000 mg | ORAL_TABLET | ORAL | Status: DC | PRN

## 2014-07-18 MED ORDER — WITCH HAZEL-GLYCERIN EX PADS: 1.0000 "application " | MEDICATED_PAD | CUTANEOUS | Status: DC | PRN

## 2014-07-18 MED ORDER — OXYCODONE-ACETAMINOPHEN 5-325 MG PO TABS: 2.0000 | ORAL_TABLET | ORAL | Status: DC | PRN

## 2014-07-18 NOTE — H&P (Signed)
Alexandra Trujillo is a 23 y.o. female presenting for spontaneous onset of labor and spontaneous rupture of membranes.  Pt states that at around 5pm on 12/10 that she began having contractions that were irregular and became more regular and about 10 minutes apart.  Then at around 4am her "water broke" with a large gush of fluid.  Pt states that she has had no complications of pregnancy and is GBS negative.  Pt states that she would not under any circumstances like an episiotomy.  Pt denies any bleeding, headache, or other s/sx.     Maternal Medical History:  Reason for admission: Rupture of membranes and contractions.  Nausea.  Contractions: Onset was 6-12 hours ago.   Frequency: regular.   Duration is approximately 7 minutes.   Perceived severity is moderate.    Fetal activity: Perceived fetal activity is normal.   Last perceived fetal movement was within the past hour.    Prenatal complications: No bleeding, cholelithiasis, HIV, PIH, infection, IUGR, nephrolithiasis, oligohydramnios, placental abnormality, polyhydramnios, pre-eclampsia, preterm labor, substance abuse, thrombocytopenia or thrombophilia.   Prenatal Complications - Diabetes: none.    OB History    Gravida Para Term Preterm AB TAB SAB Ectopic Multiple Living   4 3 3       3      Past Medical History  Diagnosis Date  . Medical history non-contributory    Past Surgical History  Procedure Laterality Date  . Iud removal  2012    malposition  . Laparoscopy      to find IUD & remove it   Family History: family history includes Thyroid disease in her maternal aunt. Social History:  reports that she has never smoked. She has never used smokeless tobacco. She reports that she does not drink alcohol or use illicit drugs.   Prenatal Transfer Tool  Maternal Diabetes: No Genetic Screening: Normal Maternal Ultrasounds/Referrals: Normal Fetal Ultrasounds or other Referrals:  None Maternal Substance Abuse:  No Significant  Maternal Medications:  None Significant Maternal Lab Results:  None Other Comments:  None  Review of Systems  Constitutional: Negative for fever and chills.  Eyes: Negative for blurred vision.  Respiratory: Negative for shortness of breath.   Cardiovascular: Negative for chest pain.  Gastrointestinal: Negative for nausea.  Genitourinary: Negative for dysuria.  Musculoskeletal: Positive for back pain.  Skin: Negative for itching and rash.  Neurological: Negative for dizziness, tingling and headaches.  Endo/Heme/Allergies: Negative.     Dilation: 2.5 Effacement (%): 50 Station: -3 Exam by:: Restaurant manager, fast foodDunbar RN  Blood pressure 117/53, pulse 87, temperature 97.9 F (36.6 C), temperature source Oral, resp. rate 18, last menstrual period 10/19/2013. Maternal Exam:  Uterine Assessment: Contraction strength is mild.  Contraction duration is 30 seconds. Contraction frequency is regular.   Abdomen: Fetal presentation: vertex  Introitus: Normal vulva. Normal vagina.  Ferning test: not done.  Nitrazine test: not done. Amniotic fluid character: not assessed.  Pelvis: adequate for delivery.   Cervix: Cervix evaluated by digital exam.     Physical Exam  Constitutional: She is oriented to person, place, and time. She appears well-developed and well-nourished.  HENT:  Head: Normocephalic and atraumatic.  Eyes: Conjunctivae and EOM are normal. Pupils are equal, round, and reactive to light.  Neck: Normal range of motion. Neck supple.  Cardiovascular: Normal rate and regular rhythm.   Respiratory: Effort normal.  Genitourinary: Vagina normal and uterus normal.  Musculoskeletal: Normal range of motion.  Neurological: She is alert and oriented to person, place, and time.  Skin: Skin is warm and dry.    Prenatal labs: ABO, Rh: --/--/A POS (12/10 [id number]) Antibody: NEG (12/10 [id number]) Rubella: 0.75 (05/11 1158) RPR: NON REAC (09/21 1012)  HBsAg: NEGATIVE (05/11 1158)  HIV: NONREACTIVE (09/21 1012)   GBS: Negative (11/23 0000)   Assessment/Plan: Pt will be admitted to the L&D for anticipation of SVD.  Pt would like epidural for pain control when needed.     Janee Mornhompson, McKenzie L 07/18/2014, 7:52 AM  I was present for the exam and agree with above.  Swede HeavenVirginia Raneen Jaffer, CNM 07/18/2014 1:56 PM

## 2014-07-18 NOTE — Progress Notes (Signed)
Patient ID: Alexandra Trujillo, female   DOB: 1991-07-17, 23 y.o.   MRN: [id number] Informed of variables, lates after pitocin increase, possibly due to tachysytole. Pitocin decreased by 2. Decels resolved. CTO closely.  RirieVirginia Edelin Fryer, CNM 07/18/2014 4:20 PM

## 2014-07-18 NOTE — Anesthesia Procedure Notes (Signed)
Epidural Patient location during procedure: OB Start time: 07/18/2014 12:00 PM  Staffing Anesthesiologist: Chanan Detwiler Performed by: anesthesiologist   Preanesthetic Checklist Completed: patient identified, site marked, surgical consent, pre-op evaluation, timeout performed, IV checked, risks and benefits discussed and monitors and equipment checked  Epidural Patient position: sitting Prep: site prepped and draped and DuraPrep Patient monitoring: continuous pulse ox and blood pressure Approach: midline Location: L3-L4 Injection technique: LOR air  Needle:  Needle type: Tuohy  Needle gauge: 17 G Needle length: 9 cm and 9 Needle insertion depth: 4 cm Catheter type: closed end flexible Catheter size: 19 Gauge Catheter at skin depth: 9 cm Test dose: negative  Assessment Events: blood not aspirated, injection not painful, no injection resistance, negative IV test and no paresthesia  Additional Notes Discussed risk of headache, infection, bleeding, nerve injury and failed or incomplete block.  Patient voices understanding and wishes to proceed.  Epidural placed easily on first attempt.  No paresthesia.  Patient tolerated procedure well with no apparent complications.  Jasmine DecemberA. Kacin Dancy, MDReason for block:procedure for pain

## 2014-07-18 NOTE — Progress Notes (Signed)
Alexandra Trujillo is a 23 y.o. [phone] at 3587w0d by ultrasound admitted for rupture of membranes  Subjective: Pt doing well.  Feeling light to moderate contractions.    Objective: BP 117/53 mmHg  Pulse 87  Temp(Src) 98 F (36.7 C) (Oral)  Resp 18  LMP 10/19/2013      FHT:  FHR: 150 bpm, variability: moderate,  accelerations:  Present,  decelerations:  Absent UC:   regular, every 7 minutes SVE:   Dilation: 2.5 Effacement (%): 50 Station: -3 Exam by:: Wells FargoDunbar RN   Labs: Lab Results  Component Value Date   WBC 14.0* 07/18/2014   HGB 11.4* 07/18/2014   HCT 32.7* 07/18/2014   MCV 94.0 07/18/2014   PLT 132* 07/18/2014    Assessment / Plan: Spontaneous labor, progressing normally  Labor: Progressing normally Preeclampsia:  no signs or symptoms of toxicity Fetal Wellbeing:  Category I Pain Control:  Epidural when necessary I/D:  n/a Anticipated MOD:  NSVD  Alexandra Trujillo, Alexandra Trujillo 07/18/2014, 9:32 AM

## 2014-07-18 NOTE — MAU Note (Signed)
Pt in room, will apply monitors 

## 2014-07-18 NOTE — Anesthesia Preprocedure Evaluation (Signed)

## 2014-07-19 DIAGNOSIS — Z3A39 39 weeks gestation of pregnancy: Secondary | ICD-10-CM | POA: Diagnosis present

## 2014-07-19 LAB — CBC
HCT: 31.4 % — ABNORMAL LOW (ref 36.0–46.0)
HEMOGLOBIN: 10.8 g/dL — AB (ref 12.0–15.0)
MCH: 32.6 pg (ref 26.0–34.0)
MCHC: 34.4 g/dL (ref 30.0–36.0)
MCV: 94.9 fL (ref 78.0–100.0)
Platelets: 151 10*3/uL (ref 150–400)
RBC: 3.31 MIL/uL — AB (ref 3.87–5.11)
RDW: 13.8 % (ref 11.5–15.5)
WBC: 14.1 10*3/uL — ABNORMAL HIGH (ref 4.0–10.5)

## 2014-07-19 MED ORDER — MEASLES, MUMPS & RUBELLA VAC ~~LOC~~ INJ
0.5000 mL | INJECTION | Freq: Once | SUBCUTANEOUS | Status: AC
  Administered 2014-07-19: 0.5 mL via SUBCUTANEOUS
  Filled 2014-07-19: qty 0.5

## 2014-07-19 NOTE — Progress Notes (Signed)
Post Partum Day 1 Subjective: no complaints, up ad lib, voiding and tolerating PO, small lochia, plans to breastfeed, Nexplanon  Objective: Blood pressure 106/57, pulse 59, temperature 97.6 F (36.4 C), temperature source Oral, resp. rate 16, height 4\' 10"  (1.473 m), weight 60.328 kg (133 lb), last menstrual period 10/19/2013, SpO2 100 %, unknown if currently breastfeeding.  Physical Exam:  General: alert, cooperative and no distress Lochia:normal flow Chest: CTAB Heart: RRR no m/r/g Abdomen: +BS, soft, nontender,  Uterine Fundus: firm DVT Evaluation: No evidence of DVT seen on physical exam. Extremities: no edema   Recent Labs  07/18/14 0620 07/19/14 0544  HGB 11.4* 10.8*  HCT 32.7* 31.4*    Assessment/Plan: Plan for discharge tomorrow   LOS: 1 day   CRESENZO-DISHMAN,Starlin Steib 07/19/2014, 7:22 AM

## 2014-07-19 NOTE — Progress Notes (Signed)
UR chart review completed.  

## 2014-07-19 NOTE — Anesthesia Postprocedure Evaluation (Signed)
Anesthesia Post Note  Patient: Alexandra Trujillo  Procedure(s) Performed: * No procedures listed *  Anesthesia type:Spinal  Patient location: Mother/Baby  Post pain: Pain level controlled  Post assessment: Post-op Vital signs reviewed  Last Vitals:  Filed Vitals:   07/19/14 0611  BP: 106/57  Pulse: 59  Temp: 36.4 C  Resp: 16    Post vital signs: Reviewed  Level of consciousness:alert  Complications: No apparent anesthesia complications

## 2014-07-19 NOTE — Lactation Note (Signed)
This note was copied from the chart of Alexandra Dolores Loryrin Yablonski. Lactation Consultation Note    Initial consult with this mom and tern baby, now  8919 hours old. Mom is an experienced breast feeder, who reports breast feeding going well. She reports some slight nipple tenderness. I told mom  to call for me to observe a latch if she would like. Lactation and breast feeding teaching cone with mom. Mom knows to call for questions/concerns.   Patient Name: Alexandra Trujillo ZOXWR'UToday's Date: 07/19/2014     Maternal Data    Feeding    LATCH Score/Interventions                      Lactation Tools Discussed/Used     Consult Status      Alfred LevinsLee, Marygrace Sandoval Anne 07/19/2014, 5:04 PM

## 2014-07-20 MED ORDER — IBUPROFEN 600 MG PO TABS: 600.0000 mg | ORAL_TABLET | Freq: Four times a day (QID) | ORAL | Status: DC

## 2014-07-20 NOTE — Discharge Summary (Signed)
Obstetric Discharge Summary Reason for Admission: rupture of membranes Prenatal Procedures: NST Intrapartum Procedures: spontaneous vaginal delivery Postpartum Procedures: none Complications-Operative and Postpartum: none HEMOGLOBIN  Date Value Ref Range Status  07/19/2014 10.8* 12.0 - 15.0 g/dL Final   HCT  Date Value Ref Range Status  07/19/2014 31.4* 36.0 - 46.0 % Final  Hospital Course: Alexandra Trujillo is a [age] y.o. female presenting for spontaneous onset of labor and spontaneous rupture of membranes. Pt states that at around 5pm on 12/10 that she began having contractions that were irregular and became more regular and about 10 minutes apart. Then at around 4am her "water broke" with a large gush of fluid. Pt states that she has had no complications of pregnancy and is GBS negative. Pt states that she would not under any circumstances like an episiotomy. Pt denies any bleeding, headache, or other s/sx.   Expand All Collapse All   Delivery Note At 6:21 PM a viable and healthy female was delivered via Vaginal, Spontaneous Delivery (Presentation: Right Occiput Anterior). APGAR: 8, 9; weight 7 lb 3.3 oz (3270 g).  Placenta status: Intact, Spontaneous. Cord: 3 vessels with the following complications: None.   Anesthesia: Epidural  Episiotomy: None Lacerations: None Suture Repair: N/A Est. Blood Loss (mL): 100  Mom to postpartum. Baby to Couplet care / Skin to Skin.  Maurie Boettcherhompson, McKenzie L 07/18/2014, 7:09 PM   I attended the delivery with the resident and I agree with the note above  Adam PhenixJames G Arnold, MD 07/18/2014     Has done well postpartum. Lochia small. Uterus firm. Wants to go home early .   Physical Exam:  Filed Vitals:   07/19/14 1254 07/19/14 1819 07/19/14 2202 07/20/14 0601  BP: 105/63 112/63 94/54 107/63  Pulse: 70 69 71 61  Temp: 98.5 F (36.9 C) 98.4 F (36.9 C) 98.3 F (36.8 C) 97.8 F (36.6 C)  TempSrc: Oral Oral Oral Rectal  Resp: 18  18 18    Height:      Weight:      SpO2: 100% 100% 99% 100%    General: alert, cooperative and no distress Lochia: appropriate Uterine Fundus: firm Incision: healing well DVT Evaluation: No evidence of DVT seen on physical exam.  Discharge Diagnoses: Term Pregnancy-delivered  Discharge Information: Date: 07/20/2014 Activity: unrestricted and pelvic rest Diet: routine Medications: PNV and Ibuprofen Condition: stable and improved Instructions: refer to practice specific booklet Discharge to: home   Newborn Data: Live born female  Birth Weight: 7 lb 3.3 oz (3270 g) APGAR: 8, 9  Home with mother.  [facility] VernonWILLIAMS,Alexandra Trujillo 07/20/2014, 9:16 AM

## 2014-07-20 NOTE — Discharge Instructions (Signed)

## 2014-07-24 ENCOUNTER — Encounter: Payer: Federal, State, Local not specified - PPO | Admitting: Obstetrics & Gynecology

## 2014-08-21 ENCOUNTER — Encounter: Payer: Federal, State, Local not specified - PPO | Admitting: Obstetrics & Gynecology

## 2014-08-21 ENCOUNTER — Encounter: Payer: Self-pay | Admitting: Obstetrics & Gynecology

## 2014-08-21 ENCOUNTER — Ambulatory Visit (INDEPENDENT_AMBULATORY_CARE_PROVIDER_SITE_OTHER): Payer: Federal, State, Local not specified - PPO | Admitting: Obstetrics & Gynecology

## 2014-08-21 VITALS — BP 133/69 | HR 62 | Resp 16

## 2014-08-21 DIAGNOSIS — Z01812 Encounter for preprocedural laboratory examination: Secondary | ICD-10-CM

## 2014-08-21 DIAGNOSIS — Z30018 Encounter for initial prescription of other contraceptives: Secondary | ICD-10-CM

## 2014-08-21 MED ORDER — ETONOGESTREL 68 MG ~~LOC~~ IMPL
68.0000 mg | DRUG_IMPLANT | Freq: Once | SUBCUTANEOUS | Status: AC
  Administered 2014-08-21: 68 mg via SUBCUTANEOUS

## 2014-08-21 NOTE — Progress Notes (Addendum)
  Subjective:     Alexandra Trujillo is a 24 y.o. female who presents for a postpartum visit. She is 5 weeks postpartum following a spontaneous vaginal delivery. I have fully reviewed the prenatal and intrapartum course. The delivery was at 39 gestational weeks. Outcome: spontaneous vaginal delivery. Anesthesia: epidural. Postpartum course has been uncomplicated. Baby's course has been uncomplicated. Baby is feeding by breast. Bleeding no bleeding. Bowel function is normal. Bladder function is normal. Patient is not sexually active. Contraception method is Nexplanon. Postpartum depression screening: negative.  The following portions of the patient's history were reviewed and updated as appropriate: allergies, current medications, past family history, past medical history, past social history, past surgical history and problem list.  Review of Systems Pertinent items are noted in HPI.   Objective:    BP 133/69 mmHg  Pulse 62  Resp 16  Breastfeeding? Yes  General:  alert   Breasts:  inspection negative, no nipple discharge or bleeding, no masses or nodularity palpable  Lungs: clear to auscultation bilaterally  Heart:  regular rate and rhythm, S1, S2 normal, no murmur, click, rub or gallop  Abdomen: soft, non-tender; bowel sounds normal; no masses,  no organomegaly   Vulva:  not evaluated  Vagina: not evaluated  Cervix:  not evaluated  Corpus: not examined  Adnexa:  not evaluated  Rectal Exam: Not performed.         UPT was negative. Consent was signed. Time out procedure was done. Her left arm was prepped with betadine and infiltrated with 3 cc of 1% lidocaine. After adequate anesthesia was assured, the Nexplanon device was placed according to standard of care. Her arm was hemostatic and was bandaged. She tolerated the procedure well.  Assessment:     Normal postpartum exam. Pap smear not done at today's visit. It was normal 5/15.  Plan:    1. Contraception: Nexplanon. She had the  Nexplanon in the past. RTC 1 year/prn sooner

## 2014-09-06 ENCOUNTER — Encounter (HOSPITAL_COMMUNITY): Payer: Self-pay | Admitting: General Practice

## 2014-09-06 ENCOUNTER — Inpatient Hospital Stay (HOSPITAL_COMMUNITY)
Admission: AD | Admit: 2014-09-06 | Discharge: 2014-09-06 | Disposition: A | Discharge status: Home / Self Care | Payer: Federal, State, Local not specified - PPO | Source: Ambulatory Visit | Attending: Obstetrics and Gynecology | Admitting: Obstetrics and Gynecology

## 2014-09-06 DIAGNOSIS — O9089 Other complications of the puerperium, not elsewhere classified: Secondary | ICD-10-CM | POA: Diagnosis not present

## 2014-09-06 DIAGNOSIS — N329 Bladder disorder, unspecified: Secondary | ICD-10-CM | POA: Insufficient documentation

## 2014-09-06 DIAGNOSIS — N8110 Cystocele, unspecified: Secondary | ICD-10-CM | POA: Diagnosis present

## 2014-09-06 LAB — URINALYSIS, ROUTINE W REFLEX MICROSCOPIC
BILIRUBIN URINE: NEGATIVE
GLUCOSE, UA: NEGATIVE mg/dL
Ketones, ur: NEGATIVE mg/dL
LEUKOCYTES UA: NEGATIVE
NITRITE: NEGATIVE
PH: 6 (ref 5.0–8.0)
PROTEIN: NEGATIVE mg/dL
Specific Gravity, Urine: 1.025 (ref 1.005–1.030)
Urobilinogen, UA: 0.2 mg/dL (ref 0.0–1.0)

## 2014-09-06 LAB — URINE MICROSCOPIC-ADD ON

## 2014-09-06 NOTE — Discharge Instructions (Signed)

## 2014-09-06 NOTE — MAU Note (Addendum)
Felt a little funky down there. Took a Ship brokermirror and looked, "urethra is ballooning out". Is able to urinate. No pain - just pressure. Vag del 12/10

## 2014-09-06 NOTE — MAU Provider Note (Signed)
  History     CSN: [id number]  Arrival date and time: 09/06/14 1657   First Provider Initiated Contact with Patient 09/06/14 1719      Chief Complaint  Patient presents with  . bladder prolapse    HPI Comments: Alexandra Trujillo 24 y.o. [id number] presents to MAU because she feels that her bladder is prolapsing after delivery on 07/18/14. She felt something like pressure and got a mirror and looked. She delivered a 7 lb baby and it was her fourth vaginal delivery. She denies any sx of UTI.      Past Medical History  Diagnosis Date  . Medical history non-contributory     Past Surgical History  Procedure Laterality Date  . Iud removal  2012    malposition  . Laparoscopy      to find IUD & remove it    Family History  Problem Relation Age of Onset  . Thyroid disease Maternal Aunt     History  Substance Use Topics  . Smoking status: Never Smoker   . Smokeless tobacco: Never Used  . Alcohol Use: No     Comment: Not since pregnancy    Allergies:  Allergies  Allergen Reactions  . Peanut-Containing Drug Products Other (See Comments)    Headache, sores in mouth  . Tree Extract Itching    Prescriptions prior to admission  Medication Sig Dispense Refill Last Dose  . ibuprofen (ADVIL,MOTRIN) 600 MG tablet Take 1 tablet (600 mg total) by mouth every 6 (six) hours. 30 tablet 0 Taking  . Omega-3 Fatty Acids (FISH OIL PO) Take 1 capsule by mouth daily.   Taking  . Prenatal Vit-Fe Fumarate-FA (MULTIVITAMIN-PRENATAL) 27-0.8 MG TABS tablet Take 1 tablet by mouth daily at 12 noon.       Review of Systems  Constitutional: Negative.   HENT: Negative.   Eyes: Negative.   Respiratory: Negative.   Cardiovascular: Negative.   Gastrointestinal: Negative.   Genitourinary:       Bladder falling out  Musculoskeletal: Negative.   Skin: Negative.   Neurological: Negative.   Psychiatric/Behavioral: Negative.    Physical Exam   Blood pressure 136/82, pulse 74, temperature 98.6 F  (37 C), temperature source Oral, resp. rate 18, last menstrual period 08/31/2014, currently breastfeeding.  Physical Exam  Constitutional: She is oriented to person, place, and time. She appears well-developed and well-nourished. No distress.  HENT:  Head: Normocephalic and atraumatic.  Eyes: Pupils are equal, round, and reactive to light.  Genitourinary:  Genital: external normal anatomy Vaginal: negative exam/ no prolapse Cervix:closed/ thick    Neurological: She is alert and oriented to person, place, and time.  Skin: Skin is warm and dry.  Psychiatric: She has a normal mood and affect. Her behavior is normal. Thought content normal.   Urine pending/ patient wants to go home  MAU Course  Procedures  MDM   Assessment and Plan   A: Bladder issue  P: Reassurance this is normal anatomy Follow up as needed  Carolynn ServeBarefoot, Alexandra Trujillo 09/06/2014, 5:38 PM

## 2017-08-30 ENCOUNTER — Other Ambulatory Visit (HOSPITAL_COMMUNITY)
Admission: RE | Admit: 2017-08-30 | Discharge: 2017-08-30 | Disposition: A | Discharge status: Home / Self Care | Payer: BLUE CROSS/BLUE SHIELD | Source: Ambulatory Visit | Attending: Obstetrics & Gynecology | Admitting: Obstetrics & Gynecology

## 2017-08-30 ENCOUNTER — Encounter: Payer: Self-pay | Admitting: Obstetrics & Gynecology

## 2017-08-30 ENCOUNTER — Ambulatory Visit (INDEPENDENT_AMBULATORY_CARE_PROVIDER_SITE_OTHER): Payer: BLUE CROSS/BLUE SHIELD | Admitting: Obstetrics & Gynecology

## 2017-08-30 VITALS — BP 120/66 | HR 67 | Resp 16 | Ht 59.0 in | Wt 130.0 lb

## 2017-08-30 DIAGNOSIS — Z30430 Encounter for insertion of intrauterine contraceptive device: Secondary | ICD-10-CM

## 2017-08-30 DIAGNOSIS — Z538 Procedure and treatment not carried out for other reasons: Secondary | ICD-10-CM

## 2017-08-30 DIAGNOSIS — Z01419 Encounter for gynecological examination (general) (routine) without abnormal findings: Secondary | ICD-10-CM | POA: Diagnosis not present

## 2017-08-30 DIAGNOSIS — Z3202 Encounter for pregnancy test, result negative: Secondary | ICD-10-CM | POA: Diagnosis not present

## 2017-08-30 LAB — POCT URINE PREGNANCY: Preg Test, Ur: NEGATIVE

## 2017-08-30 MED ORDER — PARAGARD INTRAUTERINE COPPER IU IUD: INTRAUTERINE_SYSTEM | Freq: Once | INTRAUTERINE | Status: DC

## 2017-08-31 ENCOUNTER — Telehealth: Payer: Self-pay

## 2017-08-31 ENCOUNTER — Encounter: Payer: Self-pay | Admitting: Obstetrics & Gynecology

## 2017-08-31 NOTE — Progress Notes (Signed)
Subjective:     Alexandra Trujillo is a 27 y.o. female here for a routine exam.  Current complaints: irregular bleeding from Nexplanon.  Wants paragard.    Gynecologic History Patient's last menstrual period was 08/30/2017. Contraception: Nexplanon Last Pap: 2015. Results were: normal Last mammogram: n/a.   Obstetric History OB History  Gravida Para Term Preterm AB Living  4 4 4     4   SAB TAB Ectopic Multiple Live Births        0 4    # Outcome Date GA Lbr Len/2nd Weight Sex Delivery Anes PTL Lv  4 Term 07/18/14 9465w0d 14:14 / 00:07 7 lb 3.3 oz (3.27 kg) F Vag-Spont EPI  LIV  3 Term 03/07/11 1539w4d  7 lb 8 oz (3.402 kg) M Vag-Spont EPI  LIV     Birth Comments: No lacerations  2 Term 11/14/08 2365w0d  7 lb 2 oz (3.232 kg) M Vag-Spont EPI  LIV     Birth Comments: small lac  1 Term 09/02/07 7435w0d  7 lb 4 oz (3.289 kg) M Vag-Spont EPI  LIV     Birth Comments: 4th degree episiotomy       The following portions of the patient's history were reviewed and updated as appropriate: allergies, current medications, past family history, past medical history, past social history, past surgical history and problem list.  Review of Systems Pertinent items noted in HPI and remainder of comprehensive ROS otherwise negative.    Objective:      Vitals:   08/30/17 1510  BP: 120/66  Pulse: 67  Resp: 16  Weight: 130 lb (59 kg)  Height: 4\' 11"  (1.499 m)   Vitals:  WNL General appearance: alert, cooperative and no distress  HEENT: Normocephalic, without obvious abnormality, atraumatic Eyes: negative Throat: lips, mucosa, and tongue normal; teeth and gums normal  Respiratory: Clear to auscultation bilaterally  CV: Regular rate and rhythm  Breasts:  Normal appearance, no masses or tenderness, no nipple retraction or dimpling  GI: Soft, non-tender; bowel sounds normal; no masses,  no organomegaly  GU: External Genitalia:  Tanner V, no lesion Urethra:  No prolapse   Vagina: Pink, normal rugae,  no blood or discharge  Cervix: No CMT, no lesion  Uterus:  Normal size and contour, non tender  Adnexa: Normal, no masses, non tender  Musculoskeletal: No edema, redness or tenderness in the calves or thighs; Nexplanon deeper than normal in arm.  Skin: No lesions or rash  Lymphatic: Axillary adenopathy: none     Psychiatric: Normal mood and behavior        Assessment:    Healthy female exam.   Nexplanon bleeding Undesired fertility   Plan:   Pap with co testing Failed IUD insertion attempt Pt desires sterilization Will plan for Friday procedure--LSC BTL w/ filshie and Nexplanon removal.  IUD Procedure Note Patient identified, informed consent performed.  Discussed risks of irregular bleeding, cramping, infection, malpositioning or misplacement of the IUD outside the uterus which may require further procedures. Time out was performed.  Urine pregnancy test negative.  Speculum placed in the vagina.  Cervix visualized.  Cleaned with Betadine x 2.  Grasped anteriorly with a single tooth tenaculum.  Uterus sounded to 8 cm.  Unable to insert Paragard past internal os.  Pt began having shooting pain in right hip as she did when last MD perofrated uterus.  Procedure stopped.

## 2017-08-31 NOTE — Telephone Encounter (Signed)
Called patient left message to call office to come in to sign BTL papers.

## 2017-09-01 LAB — CYTOLOGY - PAP
Chlamydia: NEGATIVE
Diagnosis: NEGATIVE
NEISSERIA GONORRHEA: NEGATIVE

## 2017-09-15 ENCOUNTER — Encounter (HOSPITAL_COMMUNITY): Payer: Self-pay

## 2017-09-15 ENCOUNTER — Encounter: Payer: Self-pay | Admitting: *Deleted

## 2017-10-17 NOTE — Patient Instructions (Signed)
Your procedure is scheduled on: Wednesday October 26, 2017 at 8:15 am  [facility] at: 6:45 am  Pick up the phone at the desk and dial [phone].  Call this number if you have problems the morning of surgery: [phone].  Remember: Do NOT eat food or drink any liquids after: Midnight on Tuesday March 19:  Take these medicines the morning of surgery with a SIP OF WATER:  STOP ALL HERBAL PRODUCTS AND VITAMINS 1 WEEK PRIOR TO SURGERY  Do NOT wear jewelry (body piercing), metal hair clips/bobby pins, make-up, or nail polish. Do NOT wear lotions, powders, or perfumes.  You may wear deoderant. Do NOT shave for 48 hours prior to surgery. Do NOT bring valuables to the hospital. Contacts, dentures, or bridgework may not be worn into surgery.  Have a responsible adult drive you home and stay with you for 24 hours after your procedure

## 2017-10-18 ENCOUNTER — Telehealth: Payer: Self-pay | Admitting: Obstetrics & Gynecology

## 2017-10-18 NOTE — Telephone Encounter (Addendum)
Pt states hospital quoted 5k for her tubal surgery so she wants to cancel the surgery and come in to have her implant removed and a new one placed. Scheduled pt appt Penne LashLeggett 11/08/17 for nexaplant removal and replacement. Call pt if we have any concerns [phone]. Otherwise she will be here for 11/08/17 appt.

## 2017-10-19 ENCOUNTER — Encounter (HOSPITAL_COMMUNITY)
Admission: RE | Admit: 2017-10-19 | Discharge: 2017-10-19 | Disposition: A | Discharge status: Home / Self Care | Payer: BLUE CROSS/BLUE SHIELD | Source: Ambulatory Visit | Attending: Obstetrics & Gynecology | Admitting: Obstetrics & Gynecology

## 2017-10-26 ENCOUNTER — Ambulatory Visit (HOSPITAL_COMMUNITY)
Admission: AD | Admit: 2017-10-26 | Payer: BLUE CROSS/BLUE SHIELD | Source: Ambulatory Visit | Admitting: Obstetrics & Gynecology

## 2017-10-26 ENCOUNTER — Encounter (HOSPITAL_COMMUNITY): Admission: AD | Payer: Self-pay | Source: Ambulatory Visit

## 2017-10-26 SURGERY — LIGATION, FALLOPIAN TUBE, LAPAROSCOPIC
Anesthesia: Choice

## 2017-11-08 ENCOUNTER — Encounter: Payer: Self-pay | Admitting: Obstetrics & Gynecology

## 2017-11-08 ENCOUNTER — Ambulatory Visit (INDEPENDENT_AMBULATORY_CARE_PROVIDER_SITE_OTHER): Payer: BLUE CROSS/BLUE SHIELD | Admitting: Obstetrics & Gynecology

## 2017-11-08 VITALS — BP 132/77 | HR 78 | Wt 132.0 lb

## 2017-11-08 DIAGNOSIS — Z975 Presence of (intrauterine) contraceptive device: Secondary | ICD-10-CM

## 2017-11-08 DIAGNOSIS — N921 Excessive and frequent menstruation with irregular cycle: Secondary | ICD-10-CM | POA: Diagnosis not present

## 2017-11-08 DIAGNOSIS — Z3009 Encounter for other general counseling and advice on contraception: Secondary | ICD-10-CM

## 2017-11-08 DIAGNOSIS — Z3046 Encounter for surveillance of implantable subdermal contraceptive: Secondary | ICD-10-CM

## 2017-11-08 MED ORDER — LEVONORGEST-ETH ESTRAD 91-DAY 0.15-0.03 MG PO TABS: 1.0000 | ORAL_TABLET | Freq: Every day | ORAL | 4 refills | Status: DC

## 2017-11-08 NOTE — Progress Notes (Signed)
   Subjective:    Patient ID: Alexandra Trujillo, female    DOB: 1990-10-12, 27 y.o.   MRN: [id number]  HPI  Pt here for birth control.  Pt can't afford BTL.  Not with partner for vasectomy.  Depo causes hair loss.  Had uterine perforation with IUD.  Failed IUD attempt with us recently.  Does not want nuva ring.  Has menstrual migraines and would like continuous OCPs.  Will order seasonale  Review of Systems  Constitutional: Negative.   Respiratory: Negative.   Cardiovascular: Negative.   Gastrointestinal: Negative.   Genitourinary: Positive for menstrual problem and vaginal bleeding.   Past Medical History:  Diagnosis Date  . Medical history non-contributory   . Uterine perforation by intrauterine contraceptive device    Family History  Problem Relation Age of Onset  . Thyroid disease Maternal Aunt       Objective:   Physical Exam  Constitutional: She is oriented to person, place, and time. She appears well-developed and well-nourished. No distress.  HENT:  Head: Normocephalic and atraumatic.  Eyes: Conjunctivae are normal.  Cardiovascular: Normal rate.  Pulmonary/Chest: Effort normal.  Abdominal: Soft. There is no tenderness.  Musculoskeletal: She exhibits no edema.  Neurological: She is alert and oriented to person, place, and time.  Skin: Skin is warm and dry.  Psychiatric: She has a normal mood and affect.  Vitals reviewed.  Assessment & Plan:  27 yo female desiring Nexplanon removal due to continued bleeding.  1.  OCPs 2.   Nexplanon removal.  Alexandra Trujillo is a 27 y.o. [id number] here for Nexplanon removal  Nexplanon Removal Patient was given informed consent for removal of her Nexplanon.  Appropriate time out taken. Nexplanon site identified.  Area prepped in usual sterile fashon. One ml of 1% lidocaine was used to anesthetize the area at the distal end of the implant. A small stab incision was made right beside the implant on the distal portion.  The Nexplanon rod  was grasped using hemostats and removed without difficulty.  There was minimal blood loss. There were no complications.  A small amount of antibiotic ointment and steri-strips were applied over the small incision.  A pressure bandage was applied to reduce any bruising.  The patient tolerated the procedure well and was given post procedure instructions.  Patient is planning to use OCPs for contraception.

## 2018-04-26 ENCOUNTER — Ambulatory Visit: Payer: BLUE CROSS/BLUE SHIELD | Admitting: Physician Assistant

## 2018-04-26 ENCOUNTER — Encounter: Payer: Self-pay | Admitting: Physician Assistant

## 2018-04-26 VITALS — BP 129/85 | HR 84 | Ht 59.0 in | Wt 123.0 lb

## 2018-04-26 DIAGNOSIS — Z862 Personal history of diseases of the blood and blood-forming organs and certain disorders involving the immune mechanism: Secondary | ICD-10-CM | POA: Diagnosis not present

## 2018-04-26 DIAGNOSIS — K120 Recurrent oral aphthae: Secondary | ICD-10-CM | POA: Diagnosis not present

## 2018-04-26 DIAGNOSIS — G43829 Menstrual migraine, not intractable, without status migrainosus: Secondary | ICD-10-CM | POA: Diagnosis not present

## 2018-04-26 DIAGNOSIS — D649 Anemia, unspecified: Secondary | ICD-10-CM | POA: Diagnosis not present

## 2018-04-26 DIAGNOSIS — J3089 Other allergic rhinitis: Secondary | ICD-10-CM

## 2018-04-26 DIAGNOSIS — R7989 Other specified abnormal findings of blood chemistry: Secondary | ICD-10-CM | POA: Diagnosis not present

## 2018-04-26 DIAGNOSIS — Z7689 Persons encountering health services in other specified circumstances: Secondary | ICD-10-CM | POA: Diagnosis not present

## 2018-04-26 DIAGNOSIS — Z9109 Other allergy status, other than to drugs and biological substances: Secondary | ICD-10-CM | POA: Insufficient documentation

## 2018-04-26 MED ORDER — LEVOCETIRIZINE DIHYDROCHLORIDE 5 MG PO TABS: 5.0000 mg | ORAL_TABLET | Freq: Every evening | ORAL | 3 refills | Status: DC

## 2018-04-26 MED ORDER — MONTELUKAST SODIUM 10 MG PO TABS: 10.0000 mg | ORAL_TABLET | Freq: Every day | ORAL | 3 refills | Status: DC

## 2018-04-26 MED ORDER — PROMETHAZINE HCL 25 MG PO TABS: 12.5000 mg | ORAL_TABLET | Freq: Four times a day (QID) | ORAL | 2 refills | Status: DC | PRN

## 2018-04-26 MED ORDER — KETOROLAC TROMETHAMINE 30 MG/ML IJ SOLN
30.0000 mg | Freq: Once | INTRAMUSCULAR | Status: AC
  Administered 2018-04-26: 30 mg via INTRAMUSCULAR

## 2018-04-26 MED ORDER — LEVONORGEST-ETH ESTRAD 91-DAY 0.15-0.03 MG PO TABS: 1.0000 | ORAL_TABLET | Freq: Every day | ORAL | 4 refills | Status: DC

## 2018-04-26 MED ORDER — TRIAMCINOLONE ACETONIDE 0.1 % MT PSTE: 1.0000 "application " | PASTE | Freq: Three times a day (TID) | OROMUCOSAL | 3 refills | Status: DC | PRN

## 2018-04-26 MED ORDER — DEXAMETHASONE SODIUM PHOSPHATE 10 MG/ML IJ SOLN
10.0000 mg | Freq: Once | INTRAMUSCULAR | Status: AC
  Administered 2018-04-26: 4 mg via INTRAMUSCULAR

## 2018-04-26 MED ORDER — RIZATRIPTAN BENZOATE 5 MG PO TBDP: 5.0000 mg | ORAL_TABLET | ORAL | 2 refills | Status: DC | PRN

## 2018-04-26 MED ORDER — CHLORHEXIDINE GLUCONATE 0.12% ORAL RINSE (MEDLINE KIT): OROMUCOSAL | 1 refills | Status: DC

## 2018-04-26 NOTE — Progress Notes (Signed)
HPI:                                                                Alexandra Trujillo is a 27 y.o. female who presents to [facility] [facility] today to establish care  Current concerns: headache  Reports she has had a migratory headache, waxing and waning, x 1 month.  States she has had "hormonal migraines" for years, triggered by her menstrual cycles and early/late pregnancy. Typical headache is occipital or temporal, throbbing, associated with photosensitivity, occasional nausea/vomiting. Her current headache is left-sided, radiates to her left cheek and jaw, occasionally throbbing, relieved with applying facial pressure. She is currently on Seasonique to try to prevent migraines, but even when she skips the placebo pills she still seems to have headache. She has taken triptans in the past, which worked well for her. She currently takes Excedrin and OTC analgesics prn, and is able to get mild relief, but headache never fully goes away. Denies vision change, intractable nausea/vomiting, focal weakness, paresthesias, gait disturbance or syncope.  She is also having recurrent "canker sores" in her mouth and throat. Mouth pain is severe. States throat is sore and glands are swollen. She has left upper dental pain from a decayed tooth as well and has made an appointment with a dentist this week.   She states she has severe environmental allergies and has chronic sinus pressure and nasal congestion. Denies fever, chills, purulent nasal drainage, otalgia, hearing loss.  Depression screen [facility] 2/9 04/26/2018  Decreased Interest 0  Down, Depressed, Hopeless 0  PHQ - 2 Score 0  Altered sleeping 0  Tired, decreased energy 3  Change in appetite 2  Feeling bad or failure about yourself  0  Trouble concentrating 0  Moving slowly or fidgety/restless 0  Suicidal thoughts 0  PHQ-9 Score 5    GAD 7 : Generalized Anxiety Score 04/26/2018  Nervous, Anxious, on Edge  0  Control/stop worrying 1  Worry too much - different things 1  Trouble relaxing 0  Restless 1  Easily annoyed or irritable 2  Afraid - awful might happen 2  Total GAD 7 Score 7      Past Medical History:  Diagnosis Date  . Allergy   . Anxiety   . 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 IUD REMOVAL  2012   malposition  . LAPAROSCOPY     to find IUD & remove it   Social History   Tobacco Use  . Smoking status: Never Smoker  . Smokeless tobacco: Never Used  Substance Use Topics  . Alcohol use: Not Currently   family history includes Hypertension in her maternal grandmother; Stroke in her maternal grandmother; Thyroid disease in her maternal aunt.    ROS:  Review of Systems  Constitutional: Positive for malaise/fatigue.  HENT: Positive for sinus pain and sore throat.   Cardiovascular: Positive for palpitations.  Musculoskeletal: Positive for neck pain.  Neurological: Positive for headaches.  Endo/Heme/Allergies: Positive for environmental allergies.  Psychiatric/Behavioral: The patient is nervous/anxious.   All other systems reviewed and are negative.    Medications: Current Outpatient Medications  Medication Sig Dispense Refill  . acetaminophen (TYLENOL) 500 MG  tablet Take 500 mg by mouth daily as needed for moderate pain or headache.    . Aspirin-Acetaminophen-Caffeine (EXCEDRIN PO) Take by mouth.    . Levonorgestrel-Ethinyl Estradiol (AMETHIA,CAMRESE) 0.15-0.03 &0.01 MG tablet Take 1 tablet by mouth daily.    . mometasone (NASONEX) 50 MCG/ACT nasal spray Place 2 sprays into the nose daily.    . chlorhexidine gluconate, MEDLINE KIT, (PERIDEX) 0.12 % solution Swish and spit 15 ml twice a day after tooth brushing 473 mL 1  . levocetirizine (XYZAL) 5 MG tablet Take 1 tablet (5 mg total) by mouth every evening. 90 tablet 3  . levonorgestrel-ethinyl estradiol (SEASONALE,INTROVALE,JOLESSA)  0.15-0.03 MG tablet Take 1 tablet by mouth daily. 1 Package 4  . montelukast (SINGULAIR) 10 MG tablet Take 1 tablet (10 mg total) by mouth at bedtime. 90 tablet 3  . promethazine (PHENERGAN) 25 MG tablet Take 0.5-1 tablets (12.5-25 mg total) by mouth every 6 (six) hours as needed for nausea or vomiting. 30 tablet 2  . rizatriptan (MAXALT-MLT) 5 MG disintegrating tablet Take 1 tablet (5 mg total) by mouth as needed for migraine. May repeat in 2 hours if needed 10 tablet 2  . triamcinolone (KENALOG) 0.1 % paste Use as directed 1 application in the mouth or throat 3 (three) times daily as needed. 5 g 3   No current facility-administered medications for this visit.    Allergies  Allergen Reactions  . Peanut-Containing Drug Products Other (See Comments)    Headache, sores in mouth  . Tree Extract Itching       Objective:  BP 129/85   Pulse 84   Ht _0  (1.499 m)   Wt 123 lb (55.8 kg)   LMP 03/12/2018 (Approximate)   Breastfeeding? No   BMI 24.84 kg/m  Gen:  Alert, appears uncomfortable, not toxic-appearing, no distress, appropriate for age 46: head normocephalic without obvious abnormality, conjunctiva and cornea clear, TM's pearly gray and semi-transparent, nasal mucosa pink, no maxillary or frontal sinus tenderness, oropharynx clear, aphthous ulcers of the left linguinal mucosa, sublingual area and mucosa of left lower lip, neck supple, no cervical adenopathy, trachea midline Pulm: Normal work of breathing, normal phonation Neuro: alert and oriented x 3, no tremor MSK: extremities atraumatic, normal gait and station Skin: intact, no rashes on exposed skin, no jaundice, no cyanosis Psych: well-groomed, cooperative, good eye contact, euthymic mood, affect mood-congruent, speech is articulate, and thought processes clear and goal-directed    No results found for this or any previous visit (from the past 72 hour(s)). No results found.    Assessment and Plan: 27 y.o. female  with   .Alexandra Trujillo was seen today for establish care.  Diagnoses and all orders for this visit:  Encounter to establish care  Environmental allergies  Aphthous ulcer of mouth -     chlorhexidine gluconate, MEDLINE KIT, (PERIDEX) 0.12 % solution; Swish and spit 15 ml twice a day after tooth brushing -     triamcinolone (KENALOG) 0.1 % paste; Use as directed 1 application in the mouth or throat 3 (three) times daily as needed. -     B12 and Folate Panel -     Iron, TIBC and Ferritin Panel -     CBC  Perennial allergic rhinitis -     montelukast (SINGULAIR) 10 MG tablet; Take 1 tablet (10 mg total) by mouth at bedtime. -     levocetirizine (XYZAL) 5 MG tablet; Take 1 tablet (5 mg total) by mouth every evening.  Menstrual migraine  without status migrainosus, not intractable -     rizatriptan (MAXALT-MLT) 5 MG disintegrating tablet; Take 1 tablet (5 mg total) by mouth as needed for migraine. May repeat in 2 hours if needed -     levonorgestrel-ethinyl estradiol (SEASONALE,INTROVALE,JOLESSA) 0.15-0.03 MG tablet; Take 1 tablet by mouth daily. -     promethazine (PHENERGAN) 25 MG tablet; Take 0.5-1 tablets (12.5-25 mg total) by mouth every 6 (six) hours as needed for nausea or vomiting. -     dexamethasone (DECADRON) injection 10 mg -     ketorolac (TORADOL) 30 MG/ML injection 30 mg  History of anemia -     B12 and Folate Panel -     Iron, TIBC and Ferritin Panel -     CBC   - Personally reviewed PMH, PSH, PFH, medications, allergies, HM - Age-appropriate cancer screening: UTD - Influenza declined - Tdap UTD - PHQ2 negative  Aphthous ulcers of mouth - likely 2/2 to allergy, which we will try to control better with Singulair and Xyzal, checking CBC and anemia panels to rule out secondary cause - avoid toothpaste with SLS - Peridex oral rinse and triamcinolone paste - counseled on general measures  Migraines - current headache does not have migrainous features and seems to be coming  from her dental pain and sinus pressure. Recommend we address these underlying causes and re-assess her headache. She was given migraine cocktail of Decadron and Toradol in office today and provided with a work note. - will re-start Maxalt prn for abortive therapy - I would like her to keep a headache diary to see if she would benefit from prophylaxis - counseled on migraine lifestyle/preventive measures   Patient education and anticipatory guidance given Patient agrees with treatment plan Follow-up in 3 months or sooner as needed if symptoms worsen or fail to improve  Darlyne Russian PA-C

## 2018-04-26 NOTE — Patient Instructions (Addendum)
Avoid Toothpaste containing Sodium Lauryl Sulfate (SLS)  For your migraines: - headache diary (paper or app like MigraineBuddy, iHeadache) -- optional B2 (riboflavin) and Magnesium for prophylaxis - take Imitrex at the first sign of migraine for abortive therapy - may repeat once after 2 hours if headache persists - do not use more than twice in 24 hours. And try not to use abortive medication more than 6 times per month - work on sleep hygiene - drink 11 cups of water per day - reduce caffeine intake - avoid alcohol - don't skip meals    Oral Ulcers Oral ulcers are sores inside the mouth or near the mouth. They may be called canker sores or cold sores, which are two types of oral ulcers. Many oral ulcers are harmless and go away on their own. In some cases, oral ulcers may require medical care to determine the cause and proper treatment. What are the causes? Common causes of this condition include:  Viral, bacterial, or fungal infection.  Emotional stress.  Foods or chemicals that irritate the mouth.  Injury or physical irritation of the mouth.  Medicines.  Allergies.  Tobacco use.  Less common causes include:  Skin disease.  A type of herpes virus infection (herpes simplexor herpes zoster).  Oral cancer.  In some cases, the cause of this condition may not be known. What increases the risk? Oral ulcers are more likely to develop in:  People who wear dental braces, dentures, or retainers.  People who do not keep their mouth clean or brush their teeth regularly.  People who have sensitive skin.  People who have conditions that affect the entire body (systemic conditions), such as immune disorders.  What are the signs or symptoms? The main symptom of this condition is one or more oval-shaped or round ulcers that have red borders. Details about symptoms may vary depending on the cause.  Location of the ulcers. They may be inside the mouth, on the gums, or on  the insides of the lips or cheeks. They may also be on the lips or on skin that is near the mouth, such as the cheeks and chin.  Pain. Ulcers can be painful and uncomfortable, or they can be painless.  Appearance of the ulcers. They may look like red blisters and be filled with fluid, or they may be white or yellow patches.  Frequency of outbreaks. Ulcers may go away permanently after one outbreak, or they may come back (recur) often or rarely.  How is this diagnosed? This condition is diagnosed with a physical exam. Your health care provider may ask you questions about your lifestyle and your medical history. You may have tests, including:  Blood tests.  Removal of a small number of cells from an ulcer to be examined under a microscope (biopsy).  How is this treated? This condition is treated by managing any pain and discomfort, and by treating the underlying cause of the ulcers, if necessary. Usually, oral ulcers resolve by themselves in 1-2 weeks. You may be told to keep your mouth clean and avoid things that cause or irritate your ulcers. Your health care provider may prescribe medicines to reduce pain and discomfort or treat the underlying cause, if this applies. Follow these instructions at home: Lifestyle  Follow instructions from your health care provider about eating or drinking restrictions. ? Drink enough fluid to keep your urine clear or pale yellow. ? Avoid foods and drinks that irritate your ulcers.  Avoid tobacco products, including cigarettes,  chewing tobacco, or e-cigarettes. If you need help quitting, ask your health care provider.  Avoid excessive alcohol use. Oral Hygiene  Avoid physical or chemical irritants that may have caused the ulcers or made them worse, such as mouthwashes that contain alcohol (ethanol). If you wear dental braces, dentures, or retainers, work with your health care provider to make sure these devices are fitted correctly.  Brush and floss your  teeth at least once every day, and get regular dental cleanings and checkups.  Gargle with a salt-water mixture 3-4 times per day or as told by your health care provider. To make a salt-water mixture, completely dissolve -1 tsp of salt in 1 cup of warm water. General instructions  Take over-the-counter and prescription medicines only as told by your health care provider.  If you have pain, wrap a cold compress in a towel and gently press it against your face to help reduce pain.  Keep all follow-up visits as told by your health care provider. This is important. Contact a health care provider if:  You have pain that gets worse or does not get better with medicine.  You have 4 or more ulcers at one time.  You have a fever.  You have new ulcers that look or feel different from other ulcers you have.  You have inflammation in one eye or both eyes.  You have ulcers that do not go away after 10 days.  You develop new symptoms in your mouth, such as: ? Bleeding or crusting around your lips or gums. ? Tooth pain. ? Difficulty swallowing.  You develop symptoms on your skin or genitals, such as: ? A rash or blisters. ? Burning or itching sensations.  Your ulcers begin or get worse after you start a new medicine. Get help right away if:  You have difficulty breathing.  You have swelling in your face or neck.  You have excessive bleeding from your mouth.  You have severe pain. This information is not intended to replace advice given to you by your health care provider. Make sure you discuss any questions you have with your health care provider. Document Released: 09/02/2004 Document Revised: 12/29/2015 Document Reviewed: 12/11/2014 Elsevier Interactive Patient Education  [employer].

## 2018-04-27 LAB — CBC
HEMATOCRIT: 37.4 % (ref 35.0–45.0)
HEMOGLOBIN: 12.6 g/dL (ref 11.7–15.5)
MCH: 30.5 pg (ref 27.0–33.0)
MCHC: 33.7 g/dL (ref 32.0–36.0)
MCV: 90.6 fL (ref 80.0–100.0)
MPV: 11 fL (ref 7.5–12.5)
Platelets: 226 10*3/uL (ref 140–400)
RBC: 4.13 10*6/uL (ref 3.80–5.10)
RDW: 11.8 % (ref 11.0–15.0)
WBC: 7.7 10*3/uL (ref 3.8–10.8)

## 2018-04-27 LAB — B12 AND FOLATE PANEL
Folate: 10.2 ng/mL
Vitamin B-12: 343 pg/mL (ref 200–1100)

## 2018-04-27 LAB — IRON,TIBC AND FERRITIN PANEL
%SAT: 34 % (ref 16–45)
FERRITIN: 57 ng/mL (ref 16–154)
IRON: 144 ug/dL (ref 40–190)
TIBC: 426 ug/dL (ref 250–450)

## 2018-08-17 ENCOUNTER — Ambulatory Visit: Payer: BLUE CROSS/BLUE SHIELD | Admitting: Physician Assistant

## 2018-08-21 ENCOUNTER — Ambulatory Visit: Payer: BLUE CROSS/BLUE SHIELD | Admitting: Physician Assistant

## 2018-09-12 ENCOUNTER — Other Ambulatory Visit: Payer: Self-pay | Admitting: Physician Assistant

## 2018-09-12 DIAGNOSIS — G43829 Menstrual migraine, not intractable, without status migrainosus: Secondary | ICD-10-CM

## 2018-10-31 ENCOUNTER — Other Ambulatory Visit: Payer: Self-pay

## 2018-10-31 ENCOUNTER — Encounter: Payer: Self-pay | Admitting: Physician Assistant

## 2018-10-31 ENCOUNTER — Ambulatory Visit (INDEPENDENT_AMBULATORY_CARE_PROVIDER_SITE_OTHER): Payer: BLUE CROSS/BLUE SHIELD | Admitting: Physician Assistant

## 2018-10-31 VITALS — HR 105 | Temp 96.9°F

## 2018-10-31 DIAGNOSIS — J029 Acute pharyngitis, unspecified: Secondary | ICD-10-CM | POA: Diagnosis not present

## 2018-10-31 DIAGNOSIS — Z20818 Contact with and (suspected) exposure to other bacterial communicable diseases: Secondary | ICD-10-CM

## 2018-10-31 MED ORDER — PENICILLIN V POTASSIUM 500 MG PO TABS: 500.0000 mg | ORAL_TABLET | Freq: Two times a day (BID) | ORAL | 0 refills | Status: AC

## 2018-10-31 NOTE — Progress Notes (Addendum)
Virtual Visit via Telephone Note  I connected with Alexandra Trujillo on 11/05/18 at  3:40 PM EDT by telephone and verified that I am speaking with the correct person using two identifiers.   I discussed the limitations, risks, security and privacy concerns of performing an evaluation and management service by telephone and the availability of in person appointments. I also discussed with the patient that there may be a patient responsible charge related to this service. The patient expressed understanding and agreed to proceed.   History of Present Illness:    Sore Throat   This is a new problem. The current episode started today. The problem has been unchanged. Neither side of throat is experiencing more pain than the other. There has been no fever. The pain is moderate. Associated symptoms include headaches, neck pain and swollen glands. Pertinent negatives include no coughing or trouble swallowing. She has had exposure to strep. She has tried cool liquids for the symptoms.    Past Medical History:  Diagnosis Date  . Allergy   . Anxiety   . 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 IUD REMOVAL  2012   malposition  . LAPAROSCOPY     to find IUD & remove it   Social History   Tobacco Use  . Smoking status: Never Smoker  . Smokeless tobacco: Never Used  Substance Use Topics  . Alcohol use: Not Currently   family history includes Hypertension in her maternal grandmother; Stroke in her maternal grandmother; Thyroid disease in her maternal aunt.    Review of Systems  Constitutional: Positive for appetite change. Negative for fatigue and fever.  HENT: Positive for sore throat. Negative for trouble swallowing.   Respiratory: Negative for cough.   Musculoskeletal: Positive for neck pain.  Allergic/Immunologic: Positive for environmental allergies and food allergies.  Neurological: Positive for headaches.   All other systems reviewed and are negative.    Medications: Current Outpatient Medications  Medication Sig Dispense Refill  . acetaminophen (TYLENOL) 500 MG tablet Take 500 mg by mouth daily as needed for moderate pain or headache.    . Aspirin-Acetaminophen-Caffeine (EXCEDRIN PO) Take by mouth.    . levocetirizine (XYZAL) 5 MG tablet Take 1 tablet (5 mg total) by mouth every evening. 90 tablet 3  . levonorgestrel-ethinyl estradiol (SEASONALE,INTROVALE,JOLESSA) 0.15-0.03 MG tablet Take 1 tablet by mouth daily. 1 Package 4  . mometasone (NASONEX) 50 MCG/ACT nasal spray Place 2 sprays into the nose daily.    . montelukast (SINGULAIR) 10 MG tablet Take 1 tablet (10 mg total) by mouth at bedtime. 90 tablet 3  . penicillin v potassium (VEETID) 500 MG tablet Take 1 tablet (500 mg total) by mouth 2 (two) times daily for 10 days. 20 tablet 0  . promethazine (PHENERGAN) 25 MG tablet Take 0.5-1 tablets (12.5-25 mg total) by mouth every 6 (six) hours as needed for nausea or vomiting. 30 tablet 2  . rizatriptan (MAXALT-MLT) 5 MG disintegrating tablet Take 1 tablet (5 mg total) by mouth as needed for migraine. May repeat in 2 hours if needed 10 tablet 2  . triamcinolone (KENALOG) 0.1 % paste Use as directed 1 application in the mouth or throat 3 (three) times daily as needed. 5 g 3   No current facility-administered medications for this visit.    Allergies  Allergen Reactions  . Peanut-Containing Drug Products Other (See Comments)    Headache, sores in mouth  . Tree  Extract Itching       Objective:  Pulse (!) 105   Temp (!) 96.9 F (36.1 C)  Pulm: Normal work of breathing, normal phonation, speaking in full sentences   No results found for this or any previous visit (from the past 72 hour(s)). No results found.    Assessment and Plan: 29 y.o. female with   .Diagnoses and all orders for this visit:  Exposure to strep throat -     penicillin v potassium (VEETID) 500 MG tablet;  Take 1 tablet (500 mg total) by mouth 2 (two) times daily for 10 days.  Sore throat -     penicillin v potassium (VEETID) 500 MG tablet; Take 1 tablet (500 mg total) by mouth 2 (two) times daily for 10 days.   Centor score=2, known Strep exposure Treating empirically for GAS pharyngitis Patient mentioned that her uvula appeared enlarged. She denies any voice change or trouble swallowing. Advised her to call back for worsening swelling, swallowing or breathing difficulty    I discussed the assessment and treatment plan with the patient. The patient was provided an opportunity to ask questions and all were answered. The patient agreed with the plan and demonstrated an understanding of the instructions.   The patient was advised to call back or seek an in-person evaluation if the symptoms worsen or if the condition fails to improve as anticipated.  I provided 5-10 minutes of non-face-to-face time during this encounter.   Carlis Stable, [place]

## 2018-11-02 ENCOUNTER — Telehealth: Payer: BLUE CROSS/BLUE SHIELD | Admitting: Physician Assistant

## 2018-11-02 DIAGNOSIS — Z20828 Contact with and (suspected) exposure to other viral communicable diseases: Secondary | ICD-10-CM

## 2018-11-02 DIAGNOSIS — Z20822 Contact with and (suspected) exposure to covid-19: Secondary | ICD-10-CM

## 2018-11-02 DIAGNOSIS — B349 Viral infection, unspecified: Secondary | ICD-10-CM

## 2018-11-02 MED ORDER — BENZONATATE 100 MG PO CAPS: 100.0000 mg | ORAL_CAPSULE | Freq: Three times a day (TID) | ORAL | 0 refills | Status: DC | PRN

## 2018-11-02 NOTE — Progress Notes (Signed)
E-Visit for Corona Virus Screening  Based on your current symptoms, you may very well have the virus, however your symptoms are mild. Currently, not all patients are being tested. If the symptoms are mild and there is not a known exposure, performing the test is not indicated.  Coronavirus disease 2019 (COVID-19) is a respiratory illness that can spread from person to person. The virus that causes COVID-19 is a new virus that was first identified in the country of [place] but is now found in multiple other countries and has spread to the [place].  Symptoms associated with the virus are mild to severe fever, cough, and shortness of breath. There is currently no vaccine to protect against COVID-19, and there is no specific antiviral treatment for the virus.   To be considered HIGH RISK for Coronavirus (COVID-19), you have to meet the following criteria:  . Traveled to [place], [place], [place], [place] or [place]; or in the [place] to [place], [place], [place], or [place]; and have fever, cough, and shortness of breath within the last 2 weeks of travel OR  . Been in close contact with a person diagnosed with COVID-19 within the last 2 weeks and have fever, cough, and shortness of breath  . IF YOU DO NOT MEET THESE CRITERIA, YOU ARE CONSIDERED LOW RISK FOR COVID-19.   It is vitally important that if you feel that you have an infection such as this virus or any other virus that you stay home and away from places where you may spread it to others.  You should self-quarantine for 14 days if you have symptoms that could potentially be coronavirus and avoid contact with people age 65 and older.   You can use medication such as A prescription cough medication called Tessalon Perles 100 mg. You may take 1-2 capsules every 8 hours as needed for cough ( A prescription has been sent in)  You may also take acetaminophen (Tylenol) as needed for fever.   Reduce your risk of any infection by using  the same precautions used for avoiding the common cold or flu:  . Wash your hands often with soap and warm water for at least 20 seconds.  If soap and water are not readily available, use an alcohol-based hand sanitizer with at least 60% alcohol.  . If coughing or sneezing, cover your mouth and nose by coughing or sneezing into the elbow areas of your shirt or coat, into a tissue or into your sleeve (not your hands). . Avoid shaking hands with others and consider head nods or verbal greetings only. . Avoid touching your eyes, nose, or mouth with unwashed hands.  . Avoid close contact with people who are sick. . Avoid places or events with large numbers of people in one location, like concerts or sporting events. . Carefully consider travel plans you have or are making. . If you are planning any travel outside or inside the [place], visit the CDC's Travelers' Health webpage for the latest health notices. . If you have some symptoms but not all symptoms, continue to monitor at home and seek medical attention if your symptoms worsen. . If you are having a medical emergency, call 911.  HOME CARE . Only take medications as instructed by your medical team. . Drink plenty of fluids and get plenty of rest. . A steam or ultrasonic humidifier can help if you have congestion.   GET HELP RIGHT AWAY IF: . You develop worsening fever. . You   become short of breath . You cough up blood. . Your symptoms become more severe MAKE SURE YOU   Understand these instructions.  Will watch your condition.  Will get help right away if you are not doing well or get worse.  Your e-visit answers were reviewed by a board certified advanced clinical practitioner to complete your personal care plan.  Depending on the condition, your plan could have included both over the counter or prescription medications.  If there is a problem please reply once you have received a response from your provider. Your safety is important to  us.  If you have drug allergies check your prescription carefully.    You can use MyChart to ask questions about today's visit, request a non-urgent call back, or ask for a work or school excuse for 24 hours related to this e-Visit. If it has been greater than 24 hours you will need to follow up with your provider, or enter a new e-Visit to address those concerns. You will get an e-mail in the next two days asking about your experience.  I hope that your e-visit has been valuable and will speed your recovery. Thank you for using e-visits.    

## 2018-11-02 NOTE — Progress Notes (Signed)
I have spent 5 minutes in review of e-visit questionnaire, review and updating patient chart, medical decision making and response to patient.   Desaree Downen Cody Elysia Grand, PA-C    

## 2018-11-03 ENCOUNTER — Emergency Department (HOSPITAL_COMMUNITY)
Admission: EM | Admit: 2018-11-03 | Discharge: 2018-11-03 | Disposition: A | Discharge status: Home / Self Care | Payer: BLUE CROSS/BLUE SHIELD | Attending: Emergency Medicine | Admitting: Emergency Medicine

## 2018-11-03 ENCOUNTER — Other Ambulatory Visit: Payer: Self-pay

## 2018-11-03 ENCOUNTER — Emergency Department (HOSPITAL_COMMUNITY): Payer: BLUE CROSS/BLUE SHIELD

## 2018-11-03 ENCOUNTER — Telehealth: Payer: Self-pay

## 2018-11-03 DIAGNOSIS — R509 Fever, unspecified: Secondary | ICD-10-CM

## 2018-11-03 DIAGNOSIS — B9789 Other viral agents as the cause of diseases classified elsewhere: Secondary | ICD-10-CM | POA: Diagnosis not present

## 2018-11-03 DIAGNOSIS — R05 Cough: Secondary | ICD-10-CM | POA: Diagnosis not present

## 2018-11-03 DIAGNOSIS — Z79899 Other long term (current) drug therapy: Secondary | ICD-10-CM | POA: Diagnosis not present

## 2018-11-03 DIAGNOSIS — J069 Acute upper respiratory infection, unspecified: Secondary | ICD-10-CM | POA: Diagnosis not present

## 2018-11-03 DIAGNOSIS — R062 Wheezing: Secondary | ICD-10-CM | POA: Diagnosis not present

## 2018-11-03 DIAGNOSIS — J029 Acute pharyngitis, unspecified: Secondary | ICD-10-CM | POA: Diagnosis present

## 2018-11-03 MED ORDER — ALBUTEROL SULFATE HFA 108 (90 BASE) MCG/ACT IN AERS
2.0000 | INHALATION_SPRAY | RESPIRATORY_TRACT | Status: DC | PRN
  Administered 2018-11-03: 2 via RESPIRATORY_TRACT
  Filled 2018-11-03: qty 6.7

## 2018-11-03 MED ORDER — ALBUTEROL SULFATE HFA 108 (90 BASE) MCG/ACT IN AERS: 2.0000 | INHALATION_SPRAY | RESPIRATORY_TRACT | 1 refills | Status: DC | PRN

## 2018-11-03 NOTE — ED Provider Notes (Signed)
[facility] EMERGENCY DEPARTMENT Provider Note   CSN: [id number] Arrival date & time: 11/03/18  1520    History   Chief Complaint Chief Complaint  Patient presents with  . Sore Throat  . Shortness of Breath    HPI Alexandra Trujillo is a 28 y.o. female.     Patient c/o non prod cough, sore throat, sinus congestion, for past 3-4 days. Symptoms acute onset, moderate, persistent, slowly worse. Family member w recent uri symptoms/strep. No trouble swallowing, states sore throat is improving. Denies chest pain. No leg pain or swelling.   The history is provided by the patient.  Sore Throat  Associated symptoms include shortness of breath. Pertinent negatives include no chest pain, no abdominal pain and no headaches.  Shortness of Breath  Associated symptoms: cough   Associated symptoms: no abdominal pain, no chest pain, no fever, no headaches, no neck pain, no rash and no vomiting     Past Medical History:  Diagnosis Date  . Allergy   . Anxiety   . 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 Environmental allergies 04/26/2018  . Aphthous ulcer of mouth 04/26/2018  . Perennial allergic rhinitis 04/26/2018  . Menstrual migraine without status migrainosus, not intractable 04/26/2018  . Hx of maternal laceration, 4th degree, currently pregnant 02/11/2014  . Rubella non-immune status, antepartum 02/11/2014  . Seasonal allergies 11/21/2013    Past Surgical History:  Procedure Laterality Date  . IUD REMOVAL  2012   malposition  . LAPAROSCOPY     to find IUD & remove it     OB History    Gravida  4   Para  4   Term  4   Preterm      AB      Living  4     SAB      TAB      Ectopic      Multiple  0   Live Births  4            Home Medications    Prior to Admission medications   Medication Sig Start Date End Date Taking? Authorizing Provider  acetaminophen  (TYLENOL) 500 MG tablet Take 500 mg by mouth daily as needed for moderate pain or headache.    [provider]  Aspirin-Acetaminophen-Caffeine (EXCEDRIN PO) Take by mouth.    [provider]  benzonatate (TESSALON) 100 MG capsule Take 1 capsule (100 mg total) by mouth 3 (three) times daily as needed for cough. 11/02/18   Waldon Merl, PA-C  levocetirizine (XYZAL) 5 MG tablet Take 1 tablet (5 mg total) by mouth every evening. 04/26/18   Carlis Stable, PA-C  levonorgestrel-ethinyl estradiol (SEASONALE,INTROVALE,JOLESSA) 0.15-0.03 MG tablet Take 1 tablet by mouth daily. 04/26/18   Carlis Stable, PA-C  mometasone (NASONEX) 50 MCG/ACT nasal spray Place 2 sprays into the nose daily.    [provider]  montelukast (SINGULAIR) 10 MG tablet Take 1 tablet (10 mg total) by mouth at bedtime. 04/26/18   Carlis Stable, PA-C  penicillin v potassium (VEETID) 500 MG tablet Take 1 tablet (500 mg total) by mouth 2 (two) times daily for 10 days. 10/31/18 11/10/18  Carlis Stable, PA-C  promethazine (PHENERGAN) 25 MG tablet Take 0.5-1 tablets (12.5-25 mg total) by mouth every 6 (six) hours as needed for nausea or vomiting. 04/26/18   Carlis Stable, PA-C  rizatriptan (MAXALT-MLT) 5 MG disintegrating tablet Take 1 tablet (5 mg total) by mouth as needed for migraine. May repeat in 2 hours if needed 04/26/18   Carlis Stable, PA-C  triamcinolone (KENALOG) 0.1 % paste Use as directed 1 application in the mouth or throat 3 (three) times daily as needed. 04/26/18   Carlis Stable, PA-C    Family History Family History  Problem Relation Age of Onset  . Thyroid disease Maternal Aunt   . Stroke Maternal Grandmother   . Hypertension Maternal Grandmother     Social History Social History   Tobacco Use  . Smoking status: Never Smoker  . Smokeless tobacco: Never Used  Substance Use Topics  . Alcohol use:  Not Currently  . Drug use: Never     Allergies   Peanut-containing drug products and Tree extract   Review of Systems Review of Systems  Constitutional: Negative for fever.  HENT: Positive for congestion and rhinorrhea.   Eyes: Negative for redness.  Respiratory: Positive for cough and shortness of breath.   Cardiovascular: Negative for chest pain and leg swelling.  Gastrointestinal: Negative for abdominal pain, diarrhea and vomiting.  Genitourinary: Negative for flank pain.  Musculoskeletal: Negative for back pain and neck pain.  Skin: Negative for rash.  Neurological: Negative for headaches.  Hematological: Does not bruise/bleed easily.  Psychiatric/Behavioral: Negative for confusion.     Physical Exam Updated Vital Signs Temp 98.1 F (36.7 C) (Oral)   Physical Exam Vitals signs and nursing note reviewed.  Constitutional:      Appearance: Normal appearance. She is well-developed.  HENT:     Head: Atraumatic.     Nose: Congestion present.     Mouth/Throat:     Mouth: Mucous membranes are moist.     Pharynx: No oropharyngeal exudate or posterior oropharyngeal erythema.  Eyes:     General: No scleral icterus.    Conjunctiva/sclera: Conjunctivae normal.  Neck:     Musculoskeletal: Normal range of motion and neck supple. No neck rigidity or muscular tenderness.     Trachea: No tracheal deviation.  Cardiovascular:     Rate and Rhythm: Regular rhythm. Tachycardia present.     Pulses: Normal pulses.     Heart sounds: Normal heart sounds. No murmur. No friction rub. No gallop.   Pulmonary:     Effort: Pulmonary effort is normal. No respiratory distress.     Breath sounds: Normal breath sounds.     Comments: Sl wheezing bil.  Abdominal:     General: Bowel sounds are normal. There is no distension.     Palpations: Abdomen is soft.     Tenderness: There is no abdominal tenderness. There is no guarding.  Genitourinary:    Comments: No cva tenderness.   Musculoskeletal:        General: No swelling or tenderness.     Right lower leg: No edema.     Left lower leg: No edema.  Lymphadenopathy:     Cervical: No cervical adenopathy.  Skin:    General: Skin is warm and dry.     Findings: No rash.  Neurological:     Mental Status: She is alert.     Comments: Alert, speech normal.   Psychiatric:        Mood and Affect: Mood normal.      ED Treatments / Results  Labs (all labs ordered are listed, but only abnormal results are displayed) Labs Reviewed - No data to display  EKG None  Radiology Dg Chest Port 1 View  Result Date: 11/03/2018 CLINICAL DATA:  Sore throat, dyspnea and wheeze EXAM: PORTABLE CHEST 1 VIEW COMPARISON:  None. FINDINGS: The heart size and mediastinal contours are within normal limits. Both lungs are clear. The visualized skeletal structures are unremarkable. IMPRESSION: No active disease. Electronically Signed   By: Tollie Eth M.D.   On: 11/03/2018 17:38    Procedures Procedures (including critical care time)  Medications Ordered in ED Medications  albuterol (PROVENTIL HFA;VENTOLIN HFA) 108 (90 Base) MCG/ACT inhaler 2 puff (has no administration in time range)     Initial Impression / Assessment and Plan / ED Course  I have reviewed the triage vital signs and the nursing notes.  Pertinent labs & imaging results that were available during my care of the patient were reviewed by me and considered in my medical decision making (see chart for details).  Albuterol mdi 2 puffs.   Cxr.   Reviewed nursing notes and prior charts for additional history.   Recheck no wheezing. Breathing comfortably, no increased wob.    cxr reviewed - no pna.  Hr 92, rr 16. Pulse ox 99%.   Patient currently appears stable for d/c.   Alexandra Trujillo was evaluated in Emergency Department on 11/03/2018 for the symptoms described in the history of present illness. She was evaluated in the context of the global COVID-19  pandemic, which necessitated consideration that the patient might be at risk for infection with the SARS-CoV-2 virus that causes COVID-19. Institutional protocols and algorithms that pertain to the evaluation of patients at risk for COVID-19 are in a state of rapid change based on information released by regulatory bodies including the CDC and federal and state organizations. These policies and algorithms were followed during the patient's care in the ED.    Final Clinical Impressions(s) / ED Diagnoses   Final diagnoses:  None    ED Discharge Orders    None       Cathren Laine, MD 11/03/18 1807

## 2018-11-03 NOTE — Discharge Instructions (Signed)
It was our pleasure to provide your ER care today - we hope that you feel better.  Rest. Drink plenty of fluids. Take acetaminophen or ibuprofen as need for fever.   May use albuterol inhaler as need if wheezing.   Quarantine/self quarantine for the next 7 days, and atleast 3 days after fever/cough resolve.   Return to ER if worse, new symptoms, increased difficulty breathing/respiratory distress, other concern.       Person Under Monitoring Name: Alexandra Trujillo  Location: [address] [place] [address]   Infection Prevention Recommendations for Individuals Confirmed to have, or Being Evaluated for, 2019 Novel Coronavirus (COVID-19) Infection Who Receive Care at Home  Individuals who are confirmed to have, or are being evaluated for, COVID-19 should follow the prevention steps below until a healthcare provider or local or state health department says they can return to normal activities.  Stay home except to get medical care You should restrict activities outside your home, except for getting medical care. Do not go to work, school, or public areas, and do not use public transportation or taxis.  Call ahead before visiting your doctor Before your medical appointment, call the healthcare provider and tell them that you have, or are being evaluated for, COVID-19 infection. This will help the healthcare providers office take steps to keep other people from getting infected. Ask your healthcare provider to call the local or state health department.  Monitor your symptoms Seek prompt medical attention if your illness is worsening (e.g., difficulty breathing). Before going to your medical appointment, call the healthcare provider and tell them that you have, or are being evaluated for, COVID-19 infection. Ask your healthcare provider to call the local or state health department.  Wear a facemask You should wear a facemask that covers your nose and mouth when you are  in the same room with other people and when you visit a healthcare provider. People who live with or visit you should also wear a facemask while they are in the same room with you.  Separate yourself from other people in your home As much as possible, you should stay in a different room from other people in your home. Also, you should use a separate bathroom, if available.  Avoid sharing household items You should not share dishes, drinking glasses, cups, eating utensils, towels, bedding, or other items with other people in your home. After using these items, you should wash them thoroughly with soap and water.  Cover your coughs and sneezes Cover your mouth and nose with a tissue when you cough or sneeze, or you can cough or sneeze into your sleeve. Throw used tissues in a lined trash can, and immediately wash your hands with soap and water for at least 20 seconds or use an alcohol-based hand rub.  Wash your [employer] your hands often and thoroughly with soap and water for at least 20 seconds. You can use an alcohol-based hand sanitizer if soap and water are not available and if your hands are not visibly dirty. Avoid touching your eyes, nose, and mouth with unwashed hands.   Prevention Steps for Caregivers and Household Members of Individuals Confirmed to have, or Being Evaluated for, COVID-19 Infection Being Cared for in the Home  If you live with, or provide care at home for, a person confirmed to have, or being evaluated for, COVID-19 infection please follow these guidelines to prevent infection:  Follow healthcare providers instructions Make sure that you understand and can  help the patient follow any healthcare provider instructions for all care.  Provide for the patients basic needs You should help the patient with basic needs in the home and provide support for getting groceries, prescriptions, and other personal needs.  Monitor the patients symptoms If they are  getting sicker, call his or her medical provider and tell them that the patient has, or is being evaluated for, COVID-19 infection. This will help the healthcare providers office take steps to keep other people from getting infected. Ask the healthcare provider to call the local or state health department.  Limit the number of people who have contact with the patient If possible, have only one caregiver for the patient. Other household members should stay in another home or place of residence. If this is not possible, they should stay in another room, or be separated from the patient as much as possible. Use a separate bathroom, if available. Restrict visitors who do not have an essential need to be in the home.  Keep older adults, very young children, and other sick people away from the patient Keep older adults, very young children, and those who have compromised immune systems or chronic health conditions away from the patient. This includes people with chronic heart, lung, or kidney conditions, diabetes, and cancer.  Ensure good ventilation Make sure that shared spaces in the home have good air flow, such as from an air conditioner or an opened window, weather permitting.  Wash your hands often Wash your hands often and thoroughly with soap and water for at least 20 seconds. You can use an alcohol based hand sanitizer if soap and water are not available and if your hands are not visibly dirty. Avoid touching your eyes, nose, and mouth with unwashed hands. Use disposable paper towels to dry your hands. If not available, use dedicated cloth towels and replace them when they become wet.  Wear a facemask and gloves Wear a disposable facemask at all times in the room and gloves when you touch or have contact with the patients blood, body fluids, and/or secretions or excretions, such as sweat, saliva, sputum, nasal mucus, vomit, urine, or feces.  Ensure the mask fits over your nose and mouth  tightly, and do not touch it during use. Throw out disposable facemasks and gloves after using them. Do not reuse. Wash your hands immediately after removing your facemask and gloves. If your personal clothing becomes contaminated, carefully remove clothing and launder. Wash your hands after handling contaminated clothing. Place all used disposable facemasks, gloves, and other waste in a lined container before disposing them with other household waste. Remove gloves and wash your hands immediately after handling these items.  Do not share dishes, glasses, or other household items with the patient Avoid sharing household items. You should not share dishes, drinking glasses, cups, eating utensils, towels, bedding, or other items with a patient who is confirmed to have, or being evaluated for, COVID-19 infection. After the person uses these items, you should wash them thoroughly with soap and water.  Wash laundry thoroughly Immediately remove and wash clothes or bedding that have blood, body fluids, and/or secretions or excretions, such as sweat, saliva, sputum, nasal mucus, vomit, urine, or feces, on them. Wear gloves when handling laundry from the patient. Read and follow directions on labels of laundry or clothing items and detergent. In general, wash and dry with the warmest temperatures recommended on the label.  Clean all areas the individual has used often Clean all touchable  surfaces, such as counters, tabletops, doorknobs, bathroom fixtures, toilets, phones, keyboards, tablets, and bedside tables, every day. Also, clean any surfaces that may have blood, body fluids, and/or secretions or excretions on them. Wear gloves when cleaning surfaces the patient has come in contact with. Use a diluted bleach solution (e.g., dilute bleach with 1 part bleach and 10 parts water) or a household disinfectant with a label that says EPA-registered for coronaviruses. To make a bleach solution at home, add 1  tablespoon of bleach to 1 quart (4 cups) of water. For a larger supply, add  cup of bleach to 1 gallon (16 cups) of water. Read labels of cleaning products and follow recommendations provided on product labels. Labels contain instructions for safe and effective use of the cleaning product including precautions you should take when applying the product, such as wearing gloves or eye protection and making sure you have good ventilation during use of the product. Remove gloves and wash hands immediately after cleaning.  Monitor yourself for signs and symptoms of illness Caregivers and household members are considered close contacts, should monitor their health, and will be asked to limit movement outside of the home to the extent possible. Follow the monitoring steps for close contacts listed on the symptom monitoring form.   ? If you have additional questions, contact your local health department or call the epidemiologist on call at [phone] (available 24/7). ? This guidance is subject to change. For the most up-to-date guidance from [facility], please refer to their website: [facility]

## 2018-11-03 NOTE — ED Triage Notes (Signed)
Patient complains of sore throat, shortness of breath, wheezing after daughter became ill with strep throat. Current antibiotic treatment. Complaints of cough with chest pain. Voice mildly muffled. Reports seasonal allergies. Reports substernal chest pain, hurts more with dep breath.  EDP at bedside.

## 2018-11-03 NOTE — Telephone Encounter (Signed)
Alexandra Trujillo complains of a worsening of cough, wheezing and shortness of breath. She was out of breath on the phone just speaking. Advised patient to go to ED.

## 2018-11-20 ENCOUNTER — Other Ambulatory Visit: Payer: Self-pay | Admitting: Obstetrics & Gynecology

## 2018-11-20 DIAGNOSIS — G43829 Menstrual migraine, not intractable, without status migrainosus: Secondary | ICD-10-CM

## 2018-11-23 ENCOUNTER — Telehealth: Payer: Self-pay

## 2018-11-23 NOTE — Telephone Encounter (Signed)
Spoke with pt and told her we received refill request for OCP. Pt is aware we will refill for 3 months due to not scheduling annuals because of COVID-19 but then she will need to come in for annual. Pt expressed understanding.

## 2019-01-31 ENCOUNTER — Ambulatory Visit (INDEPENDENT_AMBULATORY_CARE_PROVIDER_SITE_OTHER): Payer: BC Managed Care – PPO | Admitting: Physician Assistant

## 2019-01-31 ENCOUNTER — Encounter: Payer: Self-pay | Admitting: Physician Assistant

## 2019-01-31 VITALS — Temp 98.2°F | Wt 132.0 lb

## 2019-01-31 DIAGNOSIS — J3089 Other allergic rhinitis: Secondary | ICD-10-CM

## 2019-01-31 DIAGNOSIS — G43829 Menstrual migraine, not intractable, without status migrainosus: Secondary | ICD-10-CM | POA: Diagnosis not present

## 2019-01-31 MED ORDER — LEVONORGEST-ETH ESTRAD 91-DAY 0.15-0.03 MG PO TABS: 1.0000 | ORAL_TABLET | Freq: Every day | ORAL | 4 refills | Status: DC

## 2019-01-31 MED ORDER — MAGNESIUM OXIDE -MG SUPPLEMENT 500 MG PO CAPS: 500.0000 mg | ORAL_CAPSULE | Freq: Every day | ORAL | 0 refills | Status: DC

## 2019-01-31 MED ORDER — FROVATRIPTAN SUCCINATE 2.5 MG PO TABS: ORAL_TABLET | ORAL | 0 refills | Status: DC

## 2019-01-31 MED ORDER — MONTELUKAST SODIUM 10 MG PO TABS: 10.0000 mg | ORAL_TABLET | Freq: Every day | ORAL | 3 refills | Status: DC

## 2019-01-31 MED ORDER — MOMETASONE FUROATE 50 MCG/ACT NA SUSP: 2.0000 | Freq: Every day | NASAL | 6 refills | Status: DC

## 2019-01-31 MED ORDER — LEVOCETIRIZINE DIHYDROCHLORIDE 5 MG PO TABS: 5.0000 mg | ORAL_TABLET | Freq: Every evening | ORAL | 3 refills | Status: DC

## 2019-01-31 MED ORDER — DEXAMETHASONE 4 MG PO TABS: 4.0000 mg | ORAL_TABLET | Freq: Once | ORAL | 0 refills | Status: DC | PRN

## 2019-01-31 NOTE — Progress Notes (Signed)
Virtual Visit via Video Note  I connected with Alexandra Trujillo on 01/31/19 at  8:10 AM EDT by a video enabled telemedicine application and verified that I am speaking with the correct person using two identifiers.   I discussed the limitations of evaluation and management by telemedicine and the availability of in person appointments. The patient expressed understanding and agreed to proceed.  History of Present Illness: HPI:                                                                Alexandra Trujillo is a 28 y.o. female   CC: medication management  Migraines: reports she is still having intense menstrual migraines during her placebo week every 3 months. Currently on continuous OCP with withdrawal bleed every 3rd pill pack. She states she will have almost a daily headache for 1-2 weeks during that time. Maxalt and Ibuprofen offer moderate improvement where she is able to function, but it does not completely abort the migraine. She states a friend told her about a hormone patch she can wear during the placebo pills to prevent migraine.  Allergic rhinitis: reports significant improvement in sinus pressure and allergy symptoms with Xyzal and Singulair in addition to her Nasonex.  Past Medical History:  Diagnosis Date  . Allergy   . Anxiety   . Hx of maternal laceration, 4th degree, currently pregnant 02/11/2014   1st pregnancy, no laceration on 3rd baby   . Menstrual migraine   . 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 IUD REMOVAL  2012   malposition  . LAPAROSCOPY     to find IUD & remove it   Social History   Tobacco Use  . Smoking status: Never Smoker  . Smokeless tobacco: Never Used  Substance Use Topics  . Alcohol use: Not Currently   family history includes Hypertension in her maternal grandmother; Stroke in her maternal grandmother; Thyroid disease in her maternal aunt.    ROS: negative except  as noted in the HPI  Medications: Current Outpatient Medications  Medication Sig Dispense Refill  . albuterol (PROVENTIL HFA;VENTOLIN HFA) 108 (90 Base) MCG/ACT inhaler Inhale 2 puffs into the lungs every 4 (four) hours as needed for wheezing or shortness of breath. 1 Inhaler 1  . ibuprofen (ADVIL,MOTRIN) 200 MG tablet Take 400 mg by mouth every 6 (six) hours as needed for moderate pain.    Marland Kitchen. levocetirizine (XYZAL) 5 MG tablet Take 1 tablet (5 mg total) by mouth every evening. 90 tablet 3  . levonorgestrel-ethinyl estradiol (SEASONALE) 0.15-0.03 MG tablet Take 1 tablet by mouth daily. 1 Package 4  . mometasone (NASONEX) 50 MCG/ACT nasal spray Place 2 sprays into the nose daily. 17 g 6  . montelukast (SINGULAIR) 10 MG tablet Take 1 tablet (10 mg total) by mouth at bedtime. 90 tablet 3  . triamcinolone (KENALOG) 0.1 % paste Use as directed 1 application in the mouth or throat 3 (three) times daily as needed. 5 g 3  . dexamethasone (DECADRON) 4 MG tablet Take 1 tablet (4 mg total) by mouth once as needed for up to 1 dose (migraine). Take for persistent migraine >24hr that has failed treatment with rescue medicine 2 tablet 0  .  frovatriptan (FROVA) 2.5 MG tablet Take 10 mg PO on day 1 of menstrual migraine, then 2.5mg  PO twice a day for 4 days 9 tablet 0  . Magnesium Oxide 500 MG CAPS Take 1 capsule (500 mg total) by mouth daily.  0  . promethazine (PHENERGAN) 25 MG tablet Take 0.5-1 tablets (12.5-25 mg total) by mouth every 6 (six) hours as needed for nausea or vomiting. (Patient not taking: Reported on 11/03/2018) 30 tablet 2   No current facility-administered medications for this visit.    Allergies  Allergen Reactions  . Tree Extract Anaphylaxis, Itching and Other (See Comments)    Plant based food, etc  . Peanut-Containing Drug Products Other (See Comments)    Headache, sores in mouth       Objective:  Temp 98.2 F (36.8 C) (Oral)   Wt 132 lb (59.9 kg)   LMP 01/31/2019   BMI 26.66  kg/m  Gen:  alert, not ill-appearing, no distress, appropriate for age 44: head normocephalic without obvious abnormality, conjunctiva and cornea clear, trachea midline Pulm: Normal work of breathing, normal phonation Neuro: alert and oriented x 3 Psych: cooperative, euthymic mood, affect mood-congruent, speech is articulate, normal rate and volume; thought processes clear and goal-directed, normal judgment, good insight  BP Readings from Last 3 Encounters:  11/03/18 135/80  04/26/18 129/85  11/08/17 132/77   Wt Readings from Last 3 Encounters:  01/31/19 132 lb (59.9 kg)  11/03/18 125 lb (56.7 kg)  04/26/18 123 lb (55.8 kg)     No results found for this or any previous visit (from the past 62 hour(s)). No results found.    Assessment and Plan: 28 y.o. female with   .[name] was seen today for medication management.  Diagnoses and all orders for this visit:  Menstrual migraine without status migrainosus, not intractable -     levonorgestrel-ethinyl estradiol (SEASONALE) 0.15-0.03 MG tablet; Take 1 tablet by mouth daily. -     dexamethasone (DECADRON) 4 MG tablet; Take 1 tablet (4 mg total) by mouth once as needed for up to 1 dose (migraine). Take for persistent migraine >24hr that has failed treatment with rescue medicine -     frovatriptan (FROVA) 2.5 MG tablet; Take 10 mg PO on day 1 of menstrual migraine, then 2.5mg  PO twice a day for 4 days -     Magnesium Oxide 500 MG CAPS; Take 1 capsule (500 mg total) by mouth daily.  Perennial allergic rhinitis -     levocetirizine (XYZAL) 5 MG tablet; Take 1 tablet (5 mg total) by mouth every evening. -     montelukast (SINGULAIR) 10 MG tablet; Take 1 tablet (10 mg total) by mouth at bedtime. -     mometasone (NASONEX) 50 MCG/ACT nasal spray; Place 2 sprays into the nose daily.   Patient unable to provide BP or HR. Exam limited due to virtual visit  Refills provided as above Switching from Maxalt to Frova for menstrual migraine.  She can also take Decadron 4 mg once as needed for intractable migraine>24 hours that is not responsive to triptan  Follow-up in 1 month for migraines or sooner as needed   Follow Up Instructions:    I discussed the assessment and treatment plan with the patient. The patient was provided an opportunity to ask questions and all were answered. The patient agreed with the plan and demonstrated an understanding of the instructions.   The patient was advised to call back or seek an in-person evaluation if the  symptoms worsen or if the condition fails to improve as anticipated.  I provided 15 minutes of non-face-to-face time during this encounter.   Carlis Stableharley Elizabeth Cummings, [place]

## 2019-02-01 ENCOUNTER — Telehealth: Payer: Self-pay | Admitting: Physician Assistant

## 2019-02-01 NOTE — Telephone Encounter (Signed)
Approved today (Frova) Effective from 02/01/2019 through 01/30/2022. Pharmacy aware.

## 2019-02-07 MED ORDER — NARATRIPTAN HCL 2.5 MG PO TABS: ORAL_TABLET | ORAL | 1 refills | Status: DC

## 2019-02-07 MED ORDER — NARATRIPTAN HCL 2.5 MG PO TABS: ORAL_TABLET | ORAL | 0 refills | Status: DC

## 2019-02-28 ENCOUNTER — Encounter: Payer: Self-pay | Admitting: Physician Assistant

## 2019-05-31 DIAGNOSIS — Z20828 Contact with and (suspected) exposure to other viral communicable diseases: Secondary | ICD-10-CM | POA: Diagnosis not present

## 2019-09-25 ENCOUNTER — Ambulatory Visit: Payer: BC Managed Care – PPO | Attending: Internal Medicine

## 2019-09-25 DIAGNOSIS — Z20822 Contact with and (suspected) exposure to covid-19: Secondary | ICD-10-CM | POA: Diagnosis not present

## 2019-09-26 LAB — NOVEL CORONAVIRUS, NAA: SARS-CoV-2, NAA: NOT DETECTED

## 2019-11-01 ENCOUNTER — Ambulatory Visit: Payer: Self-pay

## 2019-11-20 ENCOUNTER — Encounter: Payer: Self-pay | Admitting: Emergency Medicine

## 2019-11-20 ENCOUNTER — Emergency Department
Admission: EM | Admit: 2019-11-20 | Discharge: 2019-11-20 | Disposition: A | Discharge status: Home / Self Care | Payer: BC Managed Care – PPO | Source: Home / Self Care

## 2019-11-20 ENCOUNTER — Other Ambulatory Visit: Payer: Self-pay

## 2019-11-20 DIAGNOSIS — J029 Acute pharyngitis, unspecified: Secondary | ICD-10-CM | POA: Diagnosis not present

## 2019-11-20 DIAGNOSIS — R0981 Nasal congestion: Secondary | ICD-10-CM | POA: Diagnosis not present

## 2019-11-20 DIAGNOSIS — R5383 Other fatigue: Secondary | ICD-10-CM

## 2019-11-20 DIAGNOSIS — R630 Anorexia: Secondary | ICD-10-CM | POA: Diagnosis not present

## 2019-11-20 HISTORY — DX: Unspecified asthma, uncomplicated: J45.909

## 2019-11-20 LAB — POCT MONO SCREEN (KUC): Mono, POC: NEGATIVE

## 2019-11-20 NOTE — ED Triage Notes (Addendum)
Fatigue, congestion, loss of appetite, sore throat started last night Her 29 yr old was negative for strep today and Covid, think she has mono.

## 2019-11-20 NOTE — Discharge Instructions (Signed)
  Your rapid mono test was negative today. A more accurate test has been sent to the lab.  If Positive, you should receive a phone call but if negative, you will not receive a phone call. You may view all test results and medical records on your free [facility] MyChart account.    You may take 500mg  acetaminophen every 4-6 hours or in combination with ibuprofen 400-600mg  every 6-8 hours as needed for pain, inflammation, and fever.  Be sure to well hydrated with clear liquids and get at least 8 hours of sleep at night, preferably more while sick.   Please follow up with family medicine in 1 week if needed.

## 2019-11-20 NOTE — ED Provider Notes (Signed)
[facility]    CSN: [id number] Arrival date & time: 11/20/19  1251      History   Chief Complaint Chief Complaint  Patient presents with  . Fatigue    HPI Alexandra Trujillo is a 29 y.o. female.   HPI Alexandra Trujillo is a 29 y.o. female presenting to UC with c/o sudden onset fatigue, nasal congestion, loss of appetite and sore throat since last night. Her 2yo daughter recently developed similar symptoms and tested negative for Strep and Covid today. Pt's daughter does go to daycare but no known sick contacts pt has been made aware of.  Pt concerned she may have mono and would like to be tested.  No hx of mono for pt in the past.  She does believe she had Covid-19 in March 2020 as she was treated for suspected pneumonia but states despite antibiotics, she was sick for about 2 months. Guidelines at that time made her ineligible for testing.  She is not concerned for Covid today.   Past Medical History:  Diagnosis Date  . Allergy   . Anxiety   . Asthma   . Hx of maternal laceration, 4th degree, currently pregnant 02/11/2014   1st pregnancy, no laceration on 3rd baby   . Menstrual migraine   . 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 Environmental allergies 04/26/2018  . Aphthous ulcer of mouth 04/26/2018  . Perennial allergic rhinitis 04/26/2018  . Menstrual migraine without status migrainosus, not intractable 04/26/2018  . Rubella non-immune status, antepartum 02/11/2014    Past Surgical History:  Procedure Laterality Date  . IUD REMOVAL  2012   malposition  . LAPAROSCOPY     to find IUD & remove it    OB History    Gravida  4   Para  4   Term  4   Preterm      AB      Living  4     SAB      TAB      Ectopic      Multiple  0   Live Births  4            Home Medications    Prior to Admission medications   Medication Sig Start Date End Date Taking?  Authorizing Provider  albuterol (PROVENTIL HFA;VENTOLIN HFA) 108 (90 Base) MCG/ACT inhaler Inhale 2 puffs into the lungs every 4 (four) hours as needed for wheezing or shortness of breath. 11/03/18   Lajean Saver, MD  ibuprofen (ADVIL,MOTRIN) 200 MG tablet Take 400 mg by mouth every 6 (six) hours as needed for moderate pain.    [provider]  levocetirizine (XYZAL) 5 MG tablet Take 1 tablet (5 mg total) by mouth every evening. 01/31/19   Trixie Dredge, PA-C  levonorgestrel-ethinyl estradiol (SEASONALE) 0.15-0.03 MG tablet Take 1 tablet by mouth daily. 01/31/19   Trixie Dredge, PA-C  Magnesium Oxide 500 MG CAPS Take 1 capsule (500 mg total) by mouth daily. 01/31/19   Trixie Dredge, PA-C  montelukast (SINGULAIR) 10 MG tablet Take 1 tablet (10 mg total) by mouth at bedtime. 01/31/19   Trixie Dredge, PA-C    Family History Family History  Problem Relation Age of Onset  . Thyroid disease Maternal Aunt   . Stroke Maternal Grandmother   . Hypertension Maternal Grandmother   . Healthy Mother   . Healthy Father  Social History Social History   Tobacco Use  . Smoking status: Never Smoker  . Smokeless tobacco: Never Used  Substance Use Topics  . Alcohol use: Not Currently  . Drug use: Never     Allergies   Tree extract and Peanut-containing drug products   Review of Systems Review of Systems  Constitutional: Positive for appetite change (decreased) and fatigue. Negative for chills and fever.  HENT: Positive for congestion and sore throat. Negative for ear pain, trouble swallowing and voice change.   Respiratory: Negative for cough and shortness of breath.   Cardiovascular: Negative for chest pain and palpitations.  Gastrointestinal: Negative for abdominal pain, diarrhea, nausea and vomiting.  Musculoskeletal: Negative for arthralgias, back pain and myalgias.  Skin: Negative for rash.  Neurological: Positive for  headaches. Negative for dizziness and light-headedness.  All other systems reviewed and are negative.    Physical Exam Triage Vital Signs ED Triage Vitals  Enc Vitals Group     BP 11/20/19 1305 132/83     Pulse Rate 11/20/19 1305 80     Resp 11/20/19 1305 18     Temp 11/20/19 1305 98.4 F (36.9 C)     Temp Source 11/20/19 1305 Oral     SpO2 11/20/19 1305 98 %     Weight 11/20/19 1309 147 lb (66.7 kg)     Height 11/20/19 1309 4\' 11"  (1.499 m)     Head Circumference --      Peak Flow --      Pain Score 11/20/19 1308 7     Pain Loc --      Pain Edu? --      Excl. in GC? --    No data found.  Updated Vital Signs BP 132/83 (BP Location: Right Arm)   Pulse 80   Temp 98.4 F (36.9 C) (Oral)   Resp 18   Ht 4\' 11"  (1.499 m)   Wt 147 lb (66.7 kg)   SpO2 98%   BMI 29.69 kg/m   Visual Acuity Right Eye Distance:   Left Eye Distance:   Bilateral Distance:    Right Eye Near:   Left Eye Near:    Bilateral Near:     Physical Exam Vitals and nursing note reviewed.  Constitutional:      General: She is not in acute distress.    Appearance: Normal appearance. She is well-developed. She is not ill-appearing, toxic-appearing or diaphoretic.  HENT:     Head: Normocephalic and atraumatic.     Right Ear: Tympanic membrane and ear canal normal.     Left Ear: Tympanic membrane and ear canal normal.     Nose: Nose normal.     Right Sinus: No maxillary sinus tenderness or frontal sinus tenderness.     Left Sinus: No maxillary sinus tenderness or frontal sinus tenderness.     Mouth/Throat:     Lips: Pink.     Mouth: Mucous membranes are moist.     Pharynx: Oropharynx is clear. Uvula midline.  Cardiovascular:     Rate and Rhythm: Normal rate and regular rhythm.  Pulmonary:     Effort: Pulmonary effort is normal. No respiratory distress.     Breath sounds: Normal breath sounds. No stridor. No wheezing, rhonchi or rales.  Musculoskeletal:        General: Normal range of motion.       Cervical back: Normal range of motion.  Skin:    General: Skin is warm and dry.  Neurological:  Mental Status: She is alert and oriented to person, place, and time.  Psychiatric:        Behavior: Behavior normal.      UC Treatments / Results  Labs (all labs ordered are listed, but only abnormal results are displayed) Labs Reviewed  EPSTEIN-BARR VIRUS EARLY D ANTIGEN ANTIBODY, IGG  EPSTEIN-BARR VIRUS NUCLEAR ANTIGEN ANTIBODY, IGG  EPSTEIN-BARR VIRUS VCA, IGM  EPSTEIN-BARR VIRUS VCA, IGG  POCT MONO SCREEN ([facility])    EKG   Radiology No results found.  Procedures Procedures (including critical care time)  Medications Ordered in UC Medications - No data to display  Initial Impression / Assessment and Plan / UC Course  I have reviewed the triage vital signs and the nursing notes.  Pertinent labs & imaging results that were available during my care of the patient were reviewed by me and considered in my medical decision making (see chart for details).     Monospot: Negative Send out labs for mono testing: pending Encouraged symptomatic tx at this time AVS provided  Final Clinical Impressions(s) / UC Diagnoses   Final diagnoses:  Sore throat  Fatigue, unspecified type  Loss of appetite  Nasal congestion     Discharge Instructions      Your rapid mono test was negative today. A more accurate test has been sent to the lab.  If Positive, you should receive a phone call but if negative, you will not receive a phone call. You may view all test results and medical records on your free [facility] MyChart account.    You may take 500mg  acetaminophen every 4-6 hours or in combination with ibuprofen 400-600mg  every 6-8 hours as needed for pain, inflammation, and fever.  Be sure to well hydrated with clear liquids and get at least 8 hours of sleep at night, preferably more while sick.   Please follow up with family medicine in 1 week if needed.     ED  Prescriptions    None     PDMP not reviewed this encounter.   Anab, Vivar, Lurene Shadow 11/20/19 1704

## 2019-11-21 LAB — EPSTEIN-BARR VIRUS VCA, IGG: EBV VCA IgG: >750 U/mL — ABNORMAL HIGH

## 2019-11-21 LAB — EPSTEIN-BARR VIRUS EARLY D ANTIGEN ANTIBODY, IGG: EBV EA IgG: <9 U/mL

## 2019-11-21 LAB — EPSTEIN-BARR VIRUS VCA, IGM: EBV VCA IgM: <36 U/mL

## 2019-11-21 LAB — EPSTEIN-BARR VIRUS NUCLEAR ANTIGEN ANTIBODY, IGG: EBV NA IgG: 157 U/mL — ABNORMAL HIGH

## 2020-02-06 ENCOUNTER — Other Ambulatory Visit: Payer: Self-pay | Admitting: Physician Assistant

## 2020-02-06 DIAGNOSIS — J3089 Other allergic rhinitis: Secondary | ICD-10-CM

## 2020-02-07 ENCOUNTER — Other Ambulatory Visit: Payer: Self-pay | Admitting: Physician Assistant

## 2020-02-07 ENCOUNTER — Encounter: Payer: Self-pay | Admitting: Physician Assistant

## 2020-02-07 DIAGNOSIS — J3089 Other allergic rhinitis: Secondary | ICD-10-CM

## 2020-02-07 DIAGNOSIS — G43829 Menstrual migraine, not intractable, without status migrainosus: Secondary | ICD-10-CM

## 2020-02-07 MED ORDER — MONTELUKAST SODIUM 10 MG PO TABS: 10.0000 mg | ORAL_TABLET | Freq: Every day | ORAL | 0 refills | Status: DC

## 2020-02-08 ENCOUNTER — Encounter: Payer: Self-pay | Admitting: Medical-Surgical

## 2020-02-08 ENCOUNTER — Telehealth (INDEPENDENT_AMBULATORY_CARE_PROVIDER_SITE_OTHER): Payer: BC Managed Care – PPO | Admitting: Medical-Surgical

## 2020-02-08 DIAGNOSIS — Z7689 Persons encountering health services in other specified circumstances: Secondary | ICD-10-CM

## 2020-02-08 DIAGNOSIS — G43829 Menstrual migraine, not intractable, without status migrainosus: Secondary | ICD-10-CM | POA: Diagnosis not present

## 2020-02-08 DIAGNOSIS — K120 Recurrent oral aphthae: Secondary | ICD-10-CM | POA: Diagnosis not present

## 2020-02-08 DIAGNOSIS — J3089 Other allergic rhinitis: Secondary | ICD-10-CM | POA: Diagnosis not present

## 2020-02-08 MED ORDER — ALBUTEROL SULFATE HFA 108 (90 BASE) MCG/ACT IN AERS: 2.0000 | INHALATION_SPRAY | RESPIRATORY_TRACT | 2 refills | Status: DC | PRN

## 2020-02-08 MED ORDER — MONTELUKAST SODIUM 10 MG PO TABS: 10.0000 mg | ORAL_TABLET | Freq: Every day | ORAL | 3 refills | Status: DC

## 2020-02-08 MED ORDER — RIZATRIPTAN BENZOATE 5 MG PO TBDP: 5.0000 mg | ORAL_TABLET | ORAL | 2 refills | Status: DC | PRN

## 2020-02-08 MED ORDER — MONTELUKAST SODIUM 10 MG PO TABS: 10.0000 mg | ORAL_TABLET | Freq: Every day | ORAL | 0 refills | Status: DC

## 2020-02-08 MED ORDER — LIDOCAINE VISCOUS HCL 2 % MT SOLN: 15.0000 mL | OROMUCOSAL | 0 refills | Status: DC | PRN

## 2020-02-08 NOTE — Progress Notes (Signed)
Virtual Visit via Video Note  I connected with Alexandra Trujillo on 02/08/20 at  1:00 PM EDT by a video enabled telemedicine application and verified that I am speaking with the correct person using two identifiers.   I discussed the limitations of evaluation and management by telemedicine and the availability of in person appointments. The patient expressed understanding and agreed to proceed.  Patient location: home Provider locations: office  Subjective:    CC: medication refills  HPI: Pleasant 29 year old female presenting for medication refills. She suffers from severe allergies and usually takes an OTC antihistamine and Singulair to manage them. She reports running out of her Singulair recently and was unable to get a refill until she established care with a new provider. Since running out of the Singulair she has developed 5 or 6 canker sores in her mouth that are significantly painful. This happens if she is not on all of her medication regularly. She is also using an albuterol inhaler as needed as she reports she has developed asthma post-COVID infection. She needs a refill on the albuterol as well. Notes that she has regular migraines and uses Maxalt for abortive therapy. The medication works well and she would like a refill. No new concerns or complaints. Denies fever, chills, sinus symptoms, chest pain, shortness of breath, or palpitations. Is able to eat and drink okay despite the mouth pain.   Past medical history, Surgical history, Family history not pertinant except as noted below, Social history, Allergies, and medications have been entered into the medical record, reviewed, and corrections made.   Review of Systems: See HPI for pertinent positives and negatives.   Objective:    General: Speaking clearly in complete sentences without any shortness of breath.  Alert and oriented x3.  Normal judgment. No apparent acute distress.  Impression and Recommendations:    1.  Encounter to establish care Reviewed history and available records. Due for annual physical exam.  2. Perennial allergic rhinitis Resume Singulair 10mg  daily at bedtime. Refills provided. - montelukast (SINGULAIR) 10 MG tablet; Take 1 tablet (10 mg total) by mouth at bedtime.  Dispense: 30 tablet; Refill: 0  3. Menstrual migraine without status migrainosus, not intractable Continue Maxalt as prescribed as needed. Refills provided. - rizatriptan (MAXALT-MLT) 5 MG disintegrating tablet; Take 1 tablet (5 mg total) by mouth as needed for migraine. May repeat in 2 hours if needed  Dispense: 10 tablet; Refill: 2  4. Aphthous ulcer of mouth As viscous lidocaine was very helpful for this in the past, sending in a refill for her use as needed. Advised to apply to ulcers using a cotton swab for best results.  Return if symptoms worsen or fail to improve.  20 minutes of non-face-to-face time was provided during this encounter.  I discussed the assessment and treatment plan with the patient. The patient was provided an opportunity to ask questions and all were answered. The patient agreed with the plan and demonstrated an understanding of the instructions.   The patient was advised to call back or seek an in-person evaluation if the symptoms worsen or if the condition fails to improve as anticipated.  , DNP, APRN, FNP-BC [facility]

## 2020-02-28 IMAGING — DX PORTABLE CHEST - 1 VIEW
1 series · 1 of 1 positions shown · non-contrast
Comparison: None.

CLINICAL DATA: Sore throat, dyspnea and wheeze

EXAM:
PORTABLE CHEST 1 VIEW

[chest]
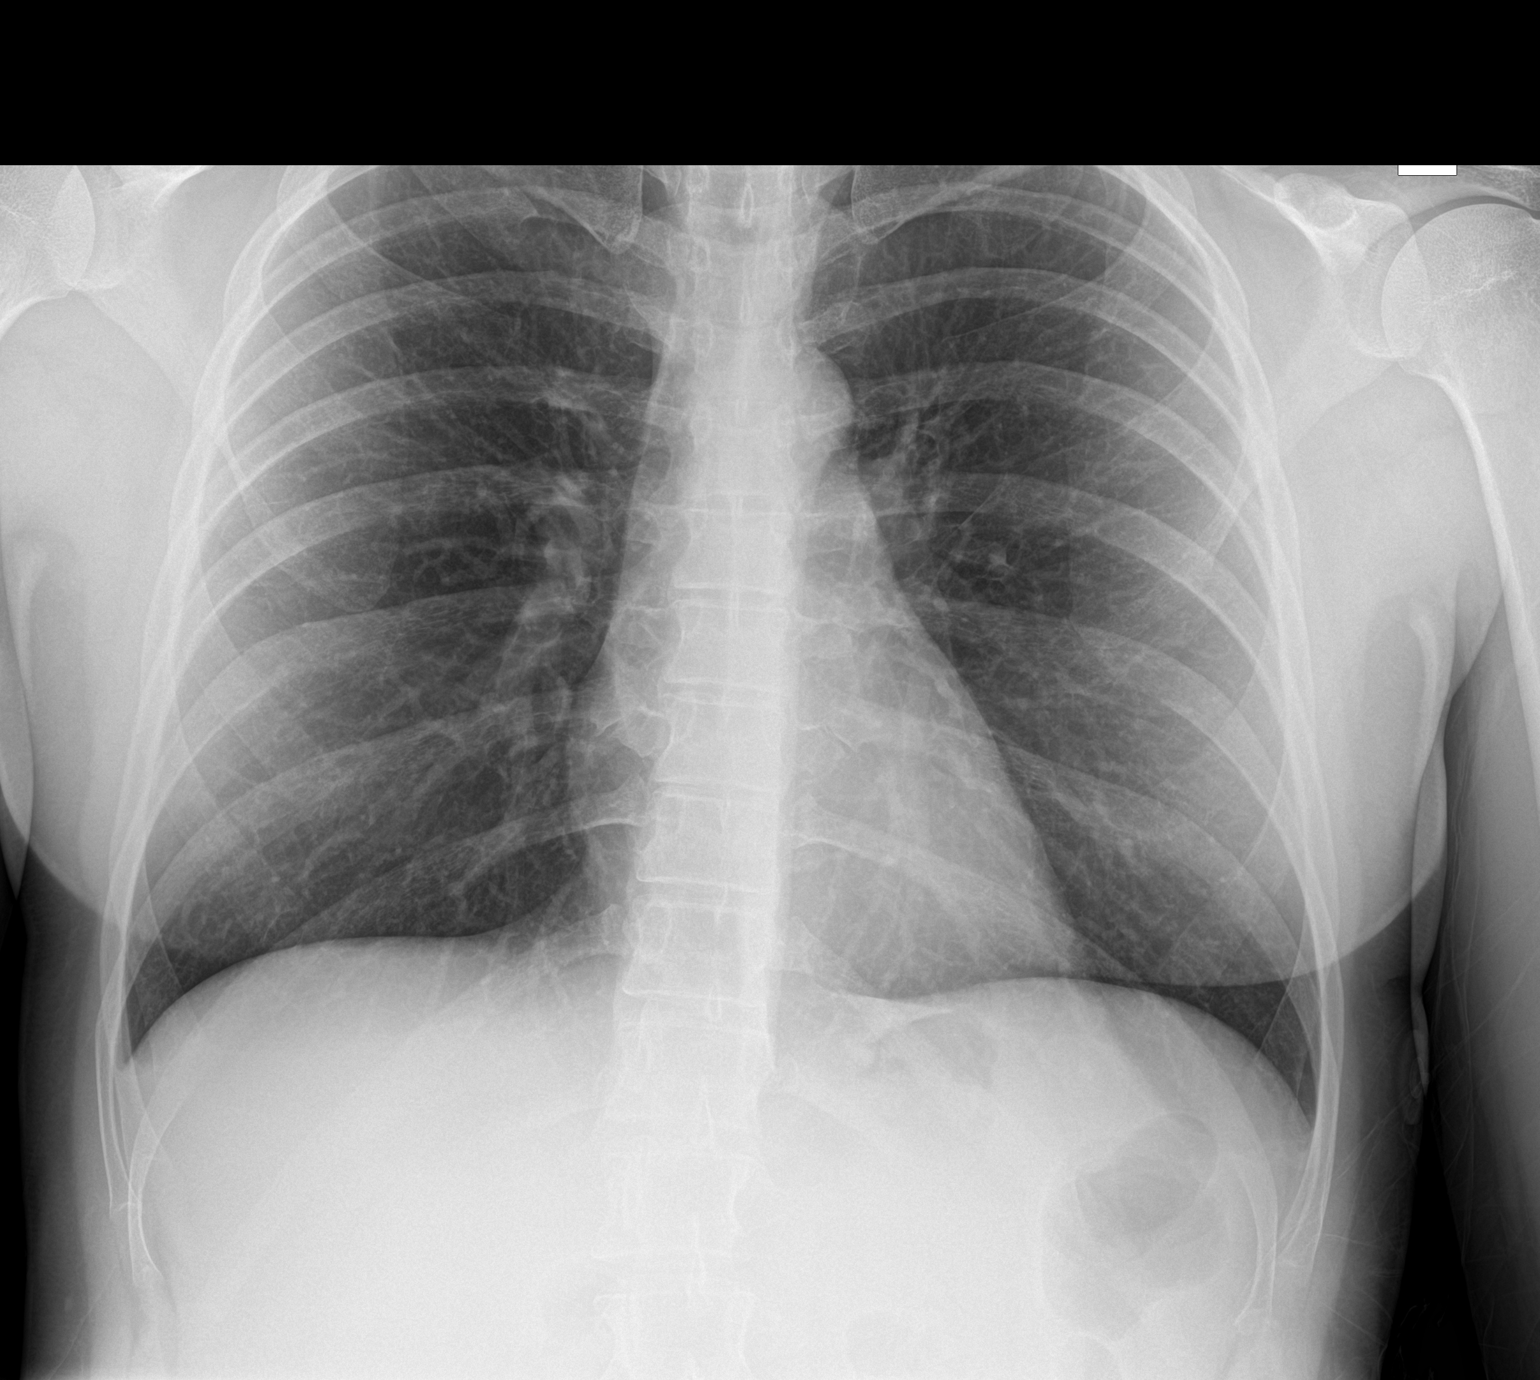

[1 of 1 positions shown; findings below may reference images not displayed]

FINDINGS: The heart size and mediastinal contours are within normal limits.
Both lungs are clear. The visualized skeletal structures are
unremarkable.
IMPRESSION: No active disease.

## 2020-06-18 ENCOUNTER — Telehealth: Payer: BC Managed Care – PPO | Admitting: Family

## 2020-06-18 DIAGNOSIS — J45901 Unspecified asthma with (acute) exacerbation: Secondary | ICD-10-CM

## 2020-06-18 MED ORDER — ALBUTEROL SULFATE HFA 108 (90 BASE) MCG/ACT IN AERS: 2.0000 | INHALATION_SPRAY | Freq: Four times a day (QID) | RESPIRATORY_TRACT | 0 refills | Status: DC | PRN

## 2020-06-18 MED ORDER — PREDNISONE 20 MG PO TABS: 40.0000 mg | ORAL_TABLET | Freq: Every day | ORAL | 0 refills | Status: AC

## 2020-06-18 NOTE — Progress Notes (Signed)
Visit for Asthma  Based on what you have shared with me, it looks like you may have a flare up of your asthma.  Asthma is a chronic (ongoing) lung disease which results in airway obstruction, inflammation and hyper-responsiveness.   Asthma symptoms vary from person to person, with common symptoms including nighttime awakening and decreased ability to participate in normal activities as a result of shortness of breath. It is often triggered by changes in weather, changes in the season, changes in air temperature, or inside (home, school, daycare or work) allergens such as animal dander, mold, mildew, woodstoves or cockroaches.   It can also be triggered by hormonal changes, extreme emotion, physical exertion or an upper respiratory tract illness.     It is important to identify the trigger, and then eliminate or avoid the trigger if possible.   If you have been prescribed medications to be taken on a regular basis, it is important to follow the asthma action plan and to follow guidelines to adjust medication in response to increasing symptoms of decreased peak expiratory flow rate  Treatment: I have prescribed: Albuterol (Proventil HFA; Ventolin HFA) 108 (90 Base) MCG/ACT Inhaler 2 puffs into the lungs every six hours as needed for wheezing or shortness of breath and Prednisone 40mg by mouth per day for  7 days  HOME CARE . Only take medications as instructed by your medical team. . Consider wearing a mask or scarf to improve breathing air temperature have been shown to decrease irritation and decrease exacerbations . Get rest. . Taking a steamy shower or using a humidifier may help nasal congestion sand ease sore throat pain. You can place a towel over your head and breathe in the steam from hot water coming from a faucet. . Using a saline nasal spray works much the same way.  . Cough  drops, hare candies and sore throat lozenges may ease your cough.  . Avoid close contacts especially the very you and the elderly . Cover your mouth if you cough or sneeze . Always remember to wash your hands.    GET HELP RIGHT AWAY IF: . You develop worsening symptoms; breathlessness at rest, drowsy, confused or agitated, unable to speak in full sentences . You have coughing fits . You develop a severe headache or visual changes . You develop shortness of breath, difficulty breathing or start having chest pain . Your symptoms persist after you have completed your treatment plan . If your symptoms do not improve within 10 days  MAKE SURE YOU . Understand these instructions. . Will watch your condition. . Will get help right away if you are not doing well or get worse.   Your e-visit answers were reviewed by a board certified advanced clinical practitioner to complete your personal care plan, Depending upon the condition, your plan could have included both over the counter or prescription medications.  Please review your pharmacy choice. Your safety is important to us. If you have drug allergies check your prescription carefully. You can use MyChart to ask questions about today's visit, request a non-urgent call back, or ask for a work or school excuse for 24 hours related to this e-Visit. If it has been greater than 24 hours you will need to follow up with your provider, or enter a new e-Visit to address those concerns.  You will get an e-mail in the next two days asking about your experience. I hope that your e-visit has been valuable and will speed your recovery. Thank   you for using e-visits.   Approximately 5 minutes was spent documenting and reviewing patient's chart.   

## 2020-06-19 ENCOUNTER — Emergency Department
Admission: EM | Admit: 2020-06-19 | Discharge: 2020-06-19 | Disposition: A | Discharge status: Home / Self Care | Payer: BC Managed Care – PPO | Source: Home / Self Care

## 2020-06-19 ENCOUNTER — Other Ambulatory Visit: Payer: Self-pay

## 2020-06-19 DIAGNOSIS — H9201 Otalgia, right ear: Secondary | ICD-10-CM

## 2020-06-19 DIAGNOSIS — R059 Cough, unspecified: Secondary | ICD-10-CM | POA: Diagnosis not present

## 2020-06-19 DIAGNOSIS — J069 Acute upper respiratory infection, unspecified: Secondary | ICD-10-CM

## 2020-06-19 MED ORDER — BENZONATATE 100 MG PO CAPS: 100.0000 mg | ORAL_CAPSULE | Freq: Three times a day (TID) | ORAL | 0 refills | Status: DC

## 2020-06-19 NOTE — ED Provider Notes (Signed)
[facility]    CSN: [id number] Arrival date & time: 06/19/20  0801      History   Chief Complaint Chief Complaint  Patient presents with  . Nasal Congestion    HPI Alexandra Trujillo is a 29 y.o. female.   HPI  Alexandra Trujillo is a 29 y.o. female presenting to UC with c/o 2-3 days of mild congestion and Right ear fullness that developed into fever of 100*F last night, and chest soreness and cough this morning. Hx of asthma but has not needed to use her inhaler more than usual.  She has been taking Dayquil and Nyquil as needed, provides temporary relief.  Pt was vaccinated for COVID 2-3 months ago with [facility] but notes others at work have been sick, requesting to be tested for COVID today.     Past Medical History:  Diagnosis Date  . Allergy   . Anxiety   . Asthma   . Hx of maternal laceration, 4th degree, currently pregnant 02/11/2014   1st pregnancy, no laceration on 3rd baby   . Menstrual migraine   . 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 Environmental allergies 04/26/2018  . Aphthous ulcer of mouth 04/26/2018  . Perennial allergic rhinitis 04/26/2018  . Menstrual migraine without status migrainosus, not intractable 04/26/2018  . Rubella non-immune status, antepartum 02/11/2014    Past Surgical History:  Procedure Laterality Date  . IUD REMOVAL  2012   malposition  . LAPAROSCOPY     to find IUD & remove it    OB History    Gravida  4   Para  4   Term  4   Preterm      AB      Living  4     SAB      TAB      Ectopic      Multiple  0   Live Births  4            Home Medications    Prior to Admission medications   Medication Sig Start Date End Date Taking? Authorizing Provider  escitalopram (LEXAPRO) 5 MG tablet Take 5 mg by mouth daily.   Yes [provider]  levocetirizine (XYZAL) 5 MG tablet Take 1 tablet (5 mg total) by mouth every  evening. 01/31/19  Yes Carlis Stable, PA-C  montelukast (SINGULAIR) 10 MG tablet Take 1 tablet (10 mg total) by mouth at bedtime. 02/08/20  Yes Christen Butter, NP  albuterol (VENTOLIN HFA) 108 (90 Base) MCG/ACT inhaler Inhale 2 puffs into the lungs every 4 (four) hours as needed for wheezing or shortness of breath. 02/08/20   Christen Butter, NP  albuterol (VENTOLIN HFA) 108 (90 Base) MCG/ACT inhaler Inhale 2 puffs into the lungs every 6 (six) hours as needed for wheezing or shortness of breath. 06/18/20   Jannifer Rodney A, FNP  benzonatate (TESSALON) 100 MG capsule Take 1-2 capsules (100-200 mg total) by mouth every 8 (eight) hours. 06/19/20   Lurene Shadow, PA-C  ibuprofen (ADVIL,MOTRIN) 200 MG tablet Take 400 mg by mouth every 6 (six) hours as needed for moderate pain.    [provider]  levonorgestrel-ethinyl estradiol (SEASONALE) 0.15-0.03 MG tablet Take 1 tablet by mouth daily. Patient not taking: Reported on 02/08/2020 01/31/19   Carlis Stable, PA-C  lidocaine (XYLOCAINE) 2 % solution Use as directed 15 mLs in the mouth or  throat as needed for mouth pain. Apply to canker sores in mouth using cotton tipped applicator as needed. 02/08/20   Christen Butter, NP  Magnesium Oxide 500 MG CAPS Take 1 capsule (500 mg total) by mouth daily. 01/31/19   Carlis Stable, PA-C  predniSONE (DELTASONE) 20 MG tablet Take 2 tablets (40 mg total) by mouth daily with breakfast for 7 days. 06/18/20 06/25/20  Jannifer Rodney A, FNP  rizatriptan (MAXALT-MLT) 5 MG disintegrating tablet Take 1 tablet (5 mg total) by mouth as needed for migraine. May repeat in 2 hours if needed 02/08/20   Christen Butter, NP    Family History Family History  Problem Relation Age of Onset  . Thyroid disease Maternal Aunt   . Stroke Maternal Grandmother   . Hypertension Maternal Grandmother   . Asthma Mother   . Healthy Father     Social History Social History   Tobacco Use  . Smoking status: Never  Smoker  . Smokeless tobacco: Never Used  Vaping Use  . Vaping Use: Never used  Substance Use Topics  . Alcohol use: Not Currently  . Drug use: Never     Allergies   Tree extract and Peanut-containing drug products   Review of Systems Review of Systems  Constitutional: Positive for fever. Negative for chills.  HENT: Positive for congestion and ear pain (right). Negative for sore throat, trouble swallowing and voice change.   Respiratory: Positive for cough. Negative for shortness of breath.   Cardiovascular: Positive for chest pain ( mild soreness). Negative for palpitations.  Gastrointestinal: Negative for abdominal pain, diarrhea, nausea and vomiting.  Musculoskeletal: Negative for arthralgias, back pain and myalgias.  Skin: Negative for rash.  Neurological: Negative for dizziness, light-headedness and headaches.  All other systems reviewed and are negative.    Physical Exam Triage Vital Signs ED Triage Vitals  Enc Vitals Group     BP 06/19/20 0814 129/86     Pulse Rate 06/19/20 0814 74     Resp 06/19/20 0814 16     Temp 06/19/20 0814 98.2 F (36.8 C)     Temp Source 06/19/20 0814 Oral     SpO2 06/19/20 0814 99 %     Weight --      Height --      Head Circumference --      Peak Flow --      Pain Score 06/19/20 0811 4     Pain Loc --      Pain Edu? --      Excl. in GC? --    No data found.  Updated Vital Signs BP 129/86 (BP Location: Right Arm)   Pulse 74   Temp 98.2 F (36.8 C) (Oral)   Resp 16   SpO2 99%   Visual Acuity Right Eye Distance:   Left Eye Distance:   Bilateral Distance:    Right Eye Near:   Left Eye Near:    Bilateral Near:     Physical Exam Vitals and nursing note reviewed.  Constitutional:      General: She is not in acute distress.    Appearance: Normal appearance. She is well-developed. She is not ill-appearing, toxic-appearing or diaphoretic.  HENT:     Head: Normocephalic and atraumatic.     Right Ear: Tympanic membrane and  ear canal normal.     Left Ear: Tympanic membrane and ear canal normal.     Nose: Nose normal.     Mouth/Throat:     Mouth: Mucous  membranes are moist.     Pharynx: Oropharynx is clear.  Cardiovascular:     Rate and Rhythm: Normal rate and regular rhythm.  Pulmonary:     Effort: Pulmonary effort is normal. No respiratory distress.     Breath sounds: Normal breath sounds. No stridor. No wheezing, rhonchi or rales.  Musculoskeletal:        General: Normal range of motion.     Cervical back: Normal range of motion and neck supple. No tenderness.  Lymphadenopathy:     Cervical: No cervical adenopathy.  Skin:    General: Skin is warm and dry.  Neurological:     Mental Status: She is alert and oriented to person, place, and time.  Psychiatric:        Behavior: Behavior normal.      UC Treatments / Results  Labs (all labs ordered are listed, but only abnormal results are displayed) Labs Reviewed  NOVEL CORONAVIRUS, NAA    EKG   Radiology No results found.  Procedures Procedures (including critical care time)  Medications Ordered in UC Medications - No data to display  Initial Impression / Assessment and Plan / UC Course  I have reviewed the triage vital signs and the nursing notes.  Pertinent labs & imaging results that were available during my care of the patient were reviewed by me and considered in my medical decision making (see chart for details).     Pt appears well, NAD No evidence of bacterial infection today COVID PCR pending F/u with PCP as needed AVS given  Final Clinical Impressions(s) / UC Diagnoses   Final diagnoses:  Cough  Viral URI with cough  Right ear pain     Discharge Instructions      You may take 500mg  acetaminophen every 4-6 hours or in combination with ibuprofen 400-600mg  every 6-8 hours as needed for pain, inflammation, and fever.  Be sure to well hydrated with clear liquids and get at least 8 hours of sleep at night,  preferably more while sick.   Please follow up with family medicine in 1 week if needed.     ED Prescriptions    Medication Sig Dispense Auth. Provider   benzonatate (TESSALON) 100 MG capsule Take 1-2 capsules (100-200 mg total) by mouth every 8 (eight) hours. 21 capsule , Lurene Shadow     PDMP not reviewed this encounter.   Pierina, Schuknecht, Lurene Shadow 06/19/20 0840

## 2020-06-19 NOTE — Discharge Instructions (Signed)
  You may take 500mg acetaminophen every 4-6 hours or in combination with ibuprofen 400-600mg every 6-8 hours as needed for pain, inflammation, and fever.  Be sure to well hydrated with clear liquids and get at least 8 hours of sleep at night, preferably more while sick.   Please follow up with family medicine in 1 week if needed.   

## 2020-06-19 NOTE — ED Triage Notes (Signed)
Patient presents to Urgent Care with complaints of nasal congestion and sinus pressure since a few days ago. Patient reports yesterday it felt like the pressure moved to her right ear. Now has a cough (dry), some people at her job are out sick with work so she would like to be tested for covid today, has been vaccinated.

## 2020-06-21 LAB — NOVEL CORONAVIRUS, NAA: SARS-CoV-2, NAA: NOT DETECTED

## 2020-06-21 LAB — SARS-COV-2, NAA 2 DAY TAT

## 2020-07-14 ENCOUNTER — Other Ambulatory Visit: Payer: Self-pay | Admitting: Family

## 2020-10-08 ENCOUNTER — Encounter: Payer: Self-pay | Admitting: Family Medicine

## 2020-10-08 ENCOUNTER — Telehealth: Payer: BC Managed Care – PPO | Admitting: Family Medicine

## 2020-10-08 DIAGNOSIS — R059 Cough, unspecified: Secondary | ICD-10-CM | POA: Diagnosis not present

## 2020-10-08 DIAGNOSIS — U071 COVID-19: Secondary | ICD-10-CM

## 2020-10-08 DIAGNOSIS — J4521 Mild intermittent asthma with (acute) exacerbation: Secondary | ICD-10-CM | POA: Diagnosis not present

## 2020-10-08 MED ORDER — PREDNISONE 10 MG (21) PO TBPK: ORAL_TABLET | ORAL | 0 refills | Status: DC

## 2020-10-08 MED ORDER — BENZONATATE 100 MG PO CAPS: 100.0000 mg | ORAL_CAPSULE | Freq: Three times a day (TID) | ORAL | 0 refills | Status: DC | PRN

## 2020-10-08 MED ORDER — FLUTICASONE PROPIONATE 50 MCG/ACT NA SUSP: 2.0000 | Freq: Every day | NASAL | 0 refills | Status: DC

## 2020-10-08 NOTE — Progress Notes (Signed)
Alexandra Trujillo, Alexandra Trujillo are scheduled for a virtual visit with your provider today.    Just as we do with appointments in the office, we must obtain your consent to participate.  Your consent will be active for this visit and any virtual visit you may have with one of our providers in the next 365 days.    If you have a MyChart account, I can also send a copy of this consent to you electronically.  All virtual visits are billed to your insurance company just like a traditional visit in the office.  As this is a virtual visit, video technology does not allow for your provider to perform a traditional examination.  This may limit your provider's ability to fully assess your condition.  If your provider identifies any concerns that need to be evaluated in person or the need to arrange testing such as labs, EKG, etc, we will make arrangements to do so.    Although advances in technology are sophisticated, we cannot ensure that it will always work on either your end or our end.  If the connection with a video visit is poor, we may have to switch to a telephone visit.  With either a video or telephone visit, we are not always able to ensure that we have a secure connection.   I need to obtain your verbal consent now.   Are you willing to proceed with your visit today?   Alexandra Trujillo has provided verbal consent on 10/08/2020 for a virtual visit (video or telephone).   Freddy Finner, NP 10/08/2020  9:44 AM   Date:  10/08/2020   ID:  Alexandra Trujillo, DOB November 04, 1990, MRN [id number]  Patient Location: Home Provider Location: Home Office   Participants: Patient and Provider for Visit and Wrap up  Method of visit: Video  Location of Patient: Home Location of Provider: Home Office Consent was obtain for visit over the video. Services rendered by provider: Visit was performed via video  A video enabled telemedicine application was used and I verified that I am speaking with the correct person using two  identifiers.  PCP:  Alexandra Butter, NP   Chief Complaint:  covid + HT  History of Present Illness:    Alexandra Trujillo is a 30 y.o. female with history as stated below. Presents video telehealth for an acute care visit secondary to having covid. Took to COVID home rapid test which were both + on Sunday. Unsure of exposure, No one else in the home is sick.  Symptoms onset was Sunday nigth with coughing and spiked fever. Monday with body aches, fever of 101, sore throat.  Coughing is worse symptom. Non production. Does have allergies- some drainage- but nothing uncontrolled. Fever is gone as of Tuesday.  Reports chest pain mild substernally and shortness of breath   Had COVID back in 2020 and then developed asthma. Has tried using the albuterol inhaler and OTC day and nyquil.  Reports oxygen is staying about 95% but heart rate is going up and down. Usually with fevers.  Past Medical, Surgical, Social History, Allergies, and Medications have been Reviewed.  Past Medical History:  Diagnosis Date  . Allergy   . Anxiety   . Asthma   . Hx of maternal laceration, 4th degree, currently pregnant 02/11/2014   1st pregnancy, no laceration on 3rd baby   . Menstrual migraine   . 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  benzonatate (TESSALON) 100 MG capsule Take 1 capsule (100 mg total) by mouth 3 (three) times daily as needed for cough.  . fluticasone (FLONASE) 50 MCG/ACT nasal spray Place 2 sprays into both nostrils daily.  . predniSONE (STERAPRED UNI-PAK 21 TAB) 10 MG (21) TBPK tablet Take as directed     Allergies:   Tree extract and Peanut-containing drug products   ROS See HPI for history of present illness.  Physical Exam Constitutional:      Appearance: Normal appearance.  HENT:     Head: Atraumatic.     Right Ear: External ear normal.     Left Ear: External ear normal.     Nose: Nose normal.  Eyes:     Extraocular  Movements: Extraocular movements intact.     Conjunctiva/sclera: Conjunctivae normal.  Pulmonary:     Comments: Noted cough and shortness of breath (mild) in conversation  Musculoskeletal:        General: Normal range of motion.     Cervical back: Normal range of motion.  Neurological:     Mental Status: She is alert and oriented to person, place, and time.  Psychiatric:        Mood and Affect: Mood normal.        Behavior: Behavior normal.        Thought Content: Thought content normal.        Judgment: Judgment normal.               A&P  1. COVID-19 virus infection - + covid infection on home test, symptomatic -due to hx of asthma and S&S will do course of pred taper, tessalon perles -flonase to help with allergy/ congestion relief. -OTC measures reviewed    - predniSONE (STERAPRED UNI-PAK 21 TAB) 10 MG (21) TBPK tablet; Take as directed  Dispense: 21 tablet; Refill: 0 - benzonatate (TESSALON) 100 MG capsule; Take 1 capsule (100 mg total) by mouth 3 (three) times daily as needed for cough.  Dispense: 30 capsule; Refill: 0 - fluticasone (FLONASE) 50 MCG/ACT nasal spray; Place 2 sprays into both nostrils daily.  Dispense: 16 g; Refill: 0  2. Cough -covid + -cough is worsen symptom -pred and perles provided -advised of OTC tx measures -pt verbalized understanding   - predniSONE (STERAPRED UNI-PAK 21 TAB) 10 MG (21) TBPK tablet; Take as directed  Dispense: 21 tablet; Refill: 0 - benzonatate (TESSALON) 100 MG capsule; Take 1 capsule (100 mg total) by mouth 3 (three) times daily as needed for cough.  Dispense: 30 capsule; Refill: 0  3. Mild intermittent asthma with acute exacerbation -covid + -pred and perles provided -advised of OTC tx measures Reviewed ED follow up signs  -pt verbalized understanding   - predniSONE (STERAPRED UNI-PAK 21 TAB) 10 MG (21) TBPK tablet; Take as directed  Dispense: 21 tablet; Refill: 0    Time:   Today, I have spent 15 minutes with the  patient with telehealth technology discussing the above problems, reviewing the chart, previous notes, medications and orders.    Tests Ordered: No orders of the defined types were placed in this encounter.   Medication Changes: Meds ordered this encounter  Medications  . predniSONE (STERAPRED UNI-PAK 21 TAB) 10 MG (21) TBPK tablet    Sig: Take as directed    Dispense:  21 tablet    Refill:  0    Order Specific Question:   Supervising Provider    Answer:   SIMPSON, MARGARET E [[id number]]  . benzonatate (TESSALON)  100 MG capsule    Sig: Take 1 capsule (100 mg total) by mouth 3 (three) times daily as needed for cough.    Dispense:  30 capsule    Refill:  0    Order Specific Question:   Supervising Provider    Answer:   SIMPSON, MARGARET E [[id number]]  . fluticasone (FLONASE) 50 MCG/ACT nasal spray    Sig: Place 2 sprays into both nostrils daily.    Dispense:  16 g    Refill:  0    Order Specific Question:   Supervising Provider    Answer:   Syliva Overman E [[id number]]     Disposition:  Follow up w pcp or as needed Signed, Freddy Finner, NP  10/08/2020 9:44 AM

## 2020-10-08 NOTE — Patient Instructions (Signed)

## 2020-10-13 ENCOUNTER — Ambulatory Visit (HOSPITAL_COMMUNITY)
Admission: RE | Admit: 2020-10-13 | Discharge: 2020-10-13 | Disposition: A | Discharge status: Home / Self Care | Payer: BC Managed Care – PPO | Source: Ambulatory Visit | Attending: Pulmonary Disease | Admitting: Pulmonary Disease

## 2020-10-13 ENCOUNTER — Other Ambulatory Visit: Payer: Self-pay

## 2020-10-13 ENCOUNTER — Telehealth (HOSPITAL_COMMUNITY): Payer: Self-pay

## 2020-10-13 ENCOUNTER — Encounter (HOSPITAL_COMMUNITY): Payer: Self-pay

## 2020-10-13 ENCOUNTER — Other Ambulatory Visit: Payer: Self-pay | Admitting: Adult Health

## 2020-10-13 ENCOUNTER — Emergency Department
Admission: EM | Admit: 2020-10-13 | Discharge: 2020-10-13 | Disposition: A | Discharge status: Home / Self Care | Payer: BC Managed Care – PPO | Source: Home / Self Care

## 2020-10-13 DIAGNOSIS — U071 COVID-19: Secondary | ICD-10-CM

## 2020-10-13 HISTORY — DX: COVID-19: U07.1

## 2020-10-13 MED ORDER — SODIUM CHLORIDE 0.9 % IV SOLN: INTRAVENOUS | Status: DC | PRN

## 2020-10-13 MED ORDER — FAMOTIDINE IN NACL 20-0.9 MG/50ML-% IV SOLN: 20.0000 mg | Freq: Once | INTRAVENOUS | Status: DC | PRN

## 2020-10-13 MED ORDER — ALBUTEROL SULFATE HFA 108 (90 BASE) MCG/ACT IN AERS: 2.0000 | INHALATION_SPRAY | Freq: Once | RESPIRATORY_TRACT | Status: DC | PRN

## 2020-10-13 MED ORDER — SOTROVIMAB 500 MG/8ML IV SOLN
500.0000 mg | Freq: Once | INTRAVENOUS | Status: AC
  Administered 2020-10-13: 500 mg via INTRAVENOUS

## 2020-10-13 MED ORDER — DIPHENHYDRAMINE HCL 50 MG/ML IJ SOLN: 50.0000 mg | Freq: Once | INTRAMUSCULAR | Status: DC | PRN

## 2020-10-13 MED ORDER — METHYLPREDNISOLONE SODIUM SUCC 125 MG IJ SOLR: 125.0000 mg | Freq: Once | INTRAMUSCULAR | Status: DC | PRN

## 2020-10-13 MED ORDER — EPINEPHRINE 0.3 MG/0.3ML IJ SOAJ: 0.3000 mg | Freq: Once | INTRAMUSCULAR | Status: DC | PRN

## 2020-10-13 NOTE — Progress Notes (Signed)
I connected by phone with Alexandra Trujillo on 10/13/2020 at 10:19 AM to discuss the potential use of a new treatment for mild to moderate COVID-19 viral infection in non-hospitalized patients.  This patient is a 30 y.o. female that meets the FDA criteria for Emergency Use Authorization of COVID monoclonal antibody sotrovimab.  Has a (+) direct SARS-CoV-2 viral test result  Has mild or moderate COVID-19   Is NOT hospitalized due to COVID-19  Is within 10 days of symptom onset  Has at least one of the high risk factor(s) for progression to severe COVID-19 and/or hospitalization as defined in EUA.  Specific high risk criteria : BMI > 25   Sx onset 2/27  Cost reviewed   I have spoken and communicated the following to the patient or parent/caregiver regarding COVID monoclonal antibody treatment:  1. FDA has authorized the emergency use for the treatment of mild to moderate COVID-19 in adults and pediatric patients with positive results of direct SARS-CoV-2 viral testing who are 2 years of age and older weighing at least 40 kg, and who are at high risk for progressing to severe COVID-19 and/or hospitalization.  2. The significant known and potential risks and benefits of COVID monoclonal antibody, and the extent to which such potential risks and benefits are unknown.  3. Information on available alternative treatments and the risks and benefits of those alternatives, including clinical trials.  4. Patients treated with COVID monoclonal antibody should continue to self-isolate and use infection control measures (e.g., wear mask, isolate, social distance, avoid sharing personal items, clean and disinfect "high touch" surfaces, and frequent handwashing) according to CDC guidelines.   5. The patient or parent/caregiver has the option to accept or refuse COVID monoclonal antibody treatment.  After reviewing this information with the patient, the patient has agreed to receive one of the  available covid 19 monoclonal antibodies and will be provided an appropriate fact sheet prior to infusion. Noreene Filbert, NP 10/13/2020 10:19 AM

## 2020-10-13 NOTE — Progress Notes (Signed)
Patient reviewed Fact Sheet for Patients, Parents, and Caregivers for Emergency Use Authorization (EUA) of sotrovimab for the Treatment of Coronavirus. Patient also reviewed and is agreeable to the estimated cost of treatment. Patient is agreeable to proceed.   

## 2020-10-13 NOTE — Discharge Instructions (Addendum)
   Home to rest Continue to push fluids The COVID clinic will call you about additional medication

## 2020-10-13 NOTE — Telephone Encounter (Signed)
Called to Discuss with patient about Covid symptoms and the use of the monoclonal antibody infusion for those with mild to moderate Covid symptoms and at a high risk of hospitalization.     Pt is qualified for this infusion due to co-morbid conditions and/or a member of an at-risk group.  QRFs: BMI>25, asthma, using rescue inhaler, albuterol.  Sx started: 2/27 Pt is unvaccinated.  RN reviewed cost of treatment of about $1000, and provided pt with CPT code [phone] for insurance. Pt stated she would call [employer].   RN will send pt details about appointment through myChart. RN informed pt an NP or PA will follow up to verify information and answer any remaining questions.

## 2020-10-13 NOTE — ED Triage Notes (Addendum)
Pt presents to Urgent Care with shortness of breath, fatigue, tingling/shaking to various body parts, and chest "burning" x 1 week. She tested positive for COVID (home test) 1 week ago. Had a virtual visit w/ provider who prescribed cough medication, steroid, and inhaler. Pt reports she is coughing less, but feels more short of breath and fatigued than she did last week.

## 2020-10-13 NOTE — Discharge Instructions (Signed)

## 2020-10-13 NOTE — ED Provider Notes (Signed)
[facility]    CSN: [id number] Arrival date & time: 10/13/20  0825      History   Chief Complaint Chief Complaint  Patient presents with  . Shortness of Breath  . Sore Throat  . Fatigue    HPI Alexandra Trujillo is a 30 y.o. female.   HPI   Healthy 30 year old.  Has underlying asthma.  Was diagnosed with COVID last week on 10/09/2018.  She had a home test.  She had a virtual visit with her provider.  She was given prednisone and Tessalon for her symptoms.  In spite of this she continues to feel tired and short of breath.  She feels like her symptoms are getting worse.  No dizziness or drowsiness.  She does have coughing, shortness of breath, and fatigue.  Symptoms started Sunday night, this is the third day. Past Medical History:  Diagnosis Date  . Allergy   . Anxiety   . Asthma   . COVID-19   . Hx of maternal laceration, 4th degree, currently pregnant 02/11/2014   1st pregnancy, no laceration on 3rd baby   . Menstrual migraine   . 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 Environmental allergies 04/26/2018  . Aphthous ulcer of mouth 04/26/2018  . Perennial allergic rhinitis 04/26/2018  . Menstrual migraine without status migrainosus, not intractable 04/26/2018  . Rubella non-immune status, antepartum 02/11/2014    Past Surgical History:  Procedure Laterality Date  . IUD REMOVAL  2012   malposition  . LAPAROSCOPY     to find IUD & remove it    OB History    Gravida  4   Para  4   Term  4   Preterm      AB      Living  4     SAB      IAB      Ectopic      Multiple  0   Live Births  4            Home Medications    Prior to Admission medications   Medication Sig Start Date End Date Taking? Authorizing Provider  albuterol (VENTOLIN HFA) 108 (90 Base) MCG/ACT inhaler Inhale 2 puffs into the lungs every 4 (four) hours as needed for wheezing or shortness of  breath. 02/08/20   Christen Butter, NP  albuterol (VENTOLIN HFA) 108 (90 Base) MCG/ACT inhaler Inhale 2 puffs into the lungs every 6 (six) hours as needed for wheezing or shortness of breath. 06/18/20   Junie Spencer, FNP  benzonatate (TESSALON) 100 MG capsule Take 1 capsule (100 mg total) by mouth 3 (three) times daily as needed for cough. 10/08/20   Freddy Finner, NP  escitalopram (LEXAPRO) 5 MG tablet Take 5 mg by mouth daily.    [provider]  fluticasone (FLONASE) 50 MCG/ACT nasal spray Place 2 sprays into both nostrils daily. 10/08/20   Freddy Finner, NP  ibuprofen (ADVIL,MOTRIN) 200 MG tablet Take 400 mg by mouth every 6 (six) hours as needed for moderate pain.    [provider]  levocetirizine (XYZAL) 5 MG tablet Take 1 tablet (5 mg total) by mouth every evening. 01/31/19   Carlis Stable, PA-C  levonorgestrel-ethinyl estradiol (SEASONALE) 0.15-0.03 MG tablet Take 1 tablet by mouth daily. Patient not taking: Reported on 02/08/2020 01/31/19   Carlis Stable, PA-C  lidocaine (XYLOCAINE) 2 %  solution Use as directed 15 mLs in the mouth or throat as needed for mouth pain. Apply to canker sores in mouth using cotton tipped applicator as needed. 02/08/20   Christen Butter, NP  Magnesium Oxide 500 MG CAPS Take 1 capsule (500 mg total) by mouth daily. 01/31/19   Carlis Stable, PA-C  montelukast (SINGULAIR) 10 MG tablet Take 1 tablet (10 mg total) by mouth at bedtime. 02/08/20   Christen Butter, NP  predniSONE (STERAPRED UNI-PAK 21 TAB) 10 MG (21) TBPK tablet Take as directed 10/08/20   Freddy Finner, NP  rizatriptan (MAXALT-MLT) 5 MG disintegrating tablet Take 1 tablet (5 mg total) by mouth as needed for migraine. May repeat in 2 hours if needed 02/08/20   Christen Butter, NP    Family History Family History  Problem Relation Age of Onset  . Thyroid disease Maternal Aunt   . Stroke Maternal Grandmother   . Hypertension Maternal Grandmother   . Asthma Mother    . Healthy Father     Social History Social History   Tobacco Use  . Smoking status: Never Smoker  . Smokeless tobacco: Never Used  Vaping Use  . Vaping Use: Never used  Substance Use Topics  . Alcohol use: Not Currently  . Drug use: Never     Allergies   Tree extract and Peanut-containing drug products   Review of Systems Review of Systems  See HPI Physical Exam Triage Vital Signs ED Triage Vitals [10/13/20 0840]  Enc Vitals Group     BP      Pulse      Resp      Temp      Temp src      SpO2      Weight 145 lb (65.8 kg)     Height 4\' 11"  (1.499 m)     Head Circumference      Peak Flow      Pain Score 3     Pain Loc      Pain Edu?      Excl. in GC?    No data found.  Updated Vital Signs BP 137/89 (BP Location: Right Arm)   Pulse 98   Temp 98.6 F (37 C) (Oral)   Resp 20   Ht 4\' 11"  (1.499 m)   Wt 65.8 kg   LMP 09/20/2020 (Exact Date)   SpO2 100%   BMI 29.29 kg/m         Physical Exam Vitals reviewed.  Constitutional:      General: She is not in acute distress.    Appearance: She is well-developed and well-nourished.  HENT:     Head: Normocephalic and atraumatic.     Mouth/Throat:     Mouth: Oropharynx is clear and moist.  Eyes:     Conjunctiva/sclera: Conjunctivae normal.     Pupils: Pupils are equal, round, and reactive to light.  Cardiovascular:     Rate and Rhythm: Normal rate.     Heart sounds: Normal heart sounds.  Pulmonary:     Effort: Pulmonary effort is normal. No respiratory distress.     Breath sounds: Normal breath sounds.     Comments: Lungs are clear.  Posterior pharynx mildly erythematous.  Exam otherwise negative Abdominal:     General: There is no distension.     Palpations: Abdomen is soft.  Musculoskeletal:        General: No edema. Normal range of motion.     Cervical back: Normal range of  motion.  Skin:    General: Skin is warm and dry.  Neurological:     Mental Status: She is alert.    Vital signs are  normal oxygen 100% Patient appears tired   UC Treatments / Results  Labs (all labs ordered are listed, but only abnormal results are displayed) Labs Reviewed - No data to display  EKG   Radiology No results found.  Procedures Procedures (including critical care time)  Medications Ordered in UC Medications - No data to display  Initial Impression / Assessment and Plan / UC Course  I have reviewed the triage vital signs and the nursing notes.  Pertinent labs & imaging results that were available during my care of the patient were reviewed by me and considered in my medical decision making (see chart for details).     I called and spoke with the post Covid care center.  They are going to arrange for patient to getTreatment Final Clinical Impressions(s) / UC Diagnoses   Final diagnoses:  None     Discharge Instructions       Home to rest Continue to push fluids The COVID clinic will call you about additional medication    ED Prescriptions    None     PDMP not reviewed this encounter.   Eustace Moore, MD 10/13/20 [phone]

## 2020-10-13 NOTE — Progress Notes (Signed)
Diagnosis: COVID-19  Physician: Dr. Patrick Wright  Procedure: Covid Infusion Clinic Med: Sotrovimab infusion - Provided patient with sotrovimab fact sheet for patients, parents, and caregivers prior to infusion.   Complications: No immediate complications noted  Discharge: Discharged home    

## 2020-10-21 ENCOUNTER — Encounter: Payer: Self-pay | Admitting: Medical-Surgical

## 2020-10-21 DIAGNOSIS — G43829 Menstrual migraine, not intractable, without status migrainosus: Secondary | ICD-10-CM

## 2020-10-22 MED ORDER — RIZATRIPTAN BENZOATE 5 MG PO TBDP: 5.0000 mg | ORAL_TABLET | ORAL | 0 refills | Status: DC | PRN

## 2020-11-04 ENCOUNTER — Encounter: Payer: Self-pay | Admitting: Physician Assistant

## 2020-11-04 ENCOUNTER — Telehealth: Payer: BC Managed Care – PPO | Admitting: Physician Assistant

## 2020-11-04 DIAGNOSIS — Z20818 Contact with and (suspected) exposure to other bacterial communicable diseases: Secondary | ICD-10-CM | POA: Diagnosis not present

## 2020-11-04 DIAGNOSIS — J020 Streptococcal pharyngitis: Secondary | ICD-10-CM | POA: Diagnosis not present

## 2020-11-04 MED ORDER — AMOXICILLIN 500 MG PO CAPS: 500.0000 mg | ORAL_CAPSULE | Freq: Two times a day (BID) | ORAL | 0 refills | Status: DC

## 2020-11-04 MED ORDER — LIDOCAINE VISCOUS HCL 2 % MT SOLN: 15.0000 mL | OROMUCOSAL | 0 refills | Status: DC | PRN

## 2020-11-04 NOTE — Patient Instructions (Signed)
Instructions sent to patients MyChart.

## 2020-11-04 NOTE — Progress Notes (Signed)
Alexandra Trujillo, Alexandra Trujillo are scheduled for a virtual visit with your provider today.    Just as we do with appointments in the office, we must obtain your consent to participate.  Your consent will be active for this visit and any virtual visit you may have with one of our providers in the next 365 days.    If you have a MyChart account, I can also send a copy of this consent to you electronically.  All virtual visits are billed to your insurance company just like a traditional visit in the office.  As this is a virtual visit, video technology does not allow for your provider to perform a traditional examination.  This may limit your provider's ability to fully assess your condition.  If your provider identifies any concerns that need to be evaluated in person or the need to arrange testing such as labs, EKG, etc, we will make arrangements to do so.     Although advances in technology are sophisticated, we cannot ensure that it will always work on either your end or our end.  If the connection with a video visit is poor, we may have to switch to a telephone visit.  With either a video or telephone visit, we are not always able to ensure that we have a secure connection.   I need to obtain your verbal consent now.   Are you willing to proceed with your visit today?   MARKIYA KEEFE has provided verbal consent on 11/04/2020 for a virtual visit (video or telephone).   Piedad Climes, PA-C 11/04/2020  2:49 PM   Virtual Visit via Video   I connected with patient on 11/04/20 at  3:00 PM EDT by a video enabled telemedicine application and verified that I am speaking with the correct person using two identifiers.  Location patient: Home Location provider: [facility] Persons participating in the virtual visit: Patient, Provider  I discussed the limitations of evaluation and management by telemedicine and the availability of in person appointments. The patient expressed understanding  and agreed to proceed.  Subjective:   HPI:   Patient presents via [facility] today c/o sore throat R>L with R-sided tonsillar swelling and exudate. Symptoms started yesterday. Her child is currently being treated for strep throat. She denies any sinus or URI symptoms. Notes fatigue. Unsure of fever as she has been taking ibuprofen for throat pain. IS keeping hydrated and using menthol drops and hard candy.    ROS:   See pertinent positives and negatives per HPI.  Patient Active Problem List   Diagnosis Date Noted  . Environmental allergies 04/26/2018  . Aphthous ulcer of mouth 04/26/2018  . Perennial allergic rhinitis 04/26/2018  . Menstrual migraine without status migrainosus, not intractable 04/26/2018  . Rubella non-immune status, antepartum 02/11/2014    Social History   Tobacco Use  . Smoking status: Never Smoker  . Smokeless tobacco: Never Used  Substance Use Topics  . Alcohol use: Not Currently    Current Outpatient Medications:  .  albuterol (VENTOLIN HFA) 108 (90 Base) MCG/ACT inhaler, Inhale 2 puffs into the lungs every 4 (four) hours as needed for wheezing or shortness of breath., Disp: 18 g, Rfl: 2 .  albuterol (VENTOLIN HFA) 108 (90 Base) MCG/ACT inhaler, Inhale 2 puffs into the lungs every 6 (six) hours as needed for wheezing or shortness of breath., Disp: 8 g, Rfl: 0 .  benzonatate (TESSALON) 100 MG capsule, Take 1 capsule (100 mg total) by mouth  3 (three) times daily as needed for cough., Disp: 30 capsule, Rfl: 0 .  escitalopram (LEXAPRO) 5 MG tablet, Take 5 mg by mouth daily., Disp: , Rfl:  .  fluticasone (FLONASE) 50 MCG/ACT nasal spray, Place 2 sprays into both nostrils daily., Disp: 16 g, Rfl: 0 .  ibuprofen (ADVIL,MOTRIN) 200 MG tablet, Take 400 mg by mouth every 6 (six) hours as needed for moderate pain., Disp: , Rfl:  .  levocetirizine (XYZAL) 5 MG tablet, Take 1 tablet (5 mg total) by mouth every evening., Disp: 90 tablet, Rfl: 3 .  levonorgestrel-ethinyl  estradiol (SEASONALE) 0.15-0.03 MG tablet, Take 1 tablet by mouth daily. (Patient not taking: Reported on 02/08/2020), Disp: 1 Package, Rfl: 4 .  lidocaine (XYLOCAINE) 2 % solution, Use as directed 15 mLs in the mouth or throat as needed for mouth pain. Apply to canker sores in mouth using cotton tipped applicator as needed., Disp: 100 mL, Rfl: 0 .  Magnesium Oxide 500 MG CAPS, Take 1 capsule (500 mg total) by mouth daily., Disp: , Rfl: 0 .  montelukast (SINGULAIR) 10 MG tablet, Take 1 tablet (10 mg total) by mouth at bedtime., Disp: 90 tablet, Rfl: 3 .  predniSONE (STERAPRED UNI-PAK 21 TAB) 10 MG (21) TBPK tablet, Take as directed, Disp: 21 tablet, Rfl: 0 .  rizatriptan (MAXALT-MLT) 5 MG disintegrating tablet, Take 1 tablet (5 mg total) by mouth as needed for migraine. May repeat in 2 hours if needed. Needs appointment., Disp: 10 tablet, Rfl: 0  Allergies  Allergen Reactions  . Tree Extract Anaphylaxis, Itching and Other (See Comments)    Plant based food, etc  . Peanut-Containing Drug Products Other (See Comments)    Headache, sores in mouth    Objective:   There were no vitals taken for this visit.  Patient is well-developed, well-nourished in no acute distress.  Resting comfortably at home.  Head is normocephalic, atraumatic.  No labored breathing.  Speech is clear and coherent with logical content.  Patient is alert and oriented at baseline.  Hard to visualize oropharynx via video. Uvula midline. R erythema and exudate noted by patient.   Assessment and Plan:   1. Strep throat exposure 2. Pharyngitis due to Streptococcus species Giving exposure and development of classic symptoms, wills tart treatment for strep with Amoxicillin 500 mg BID x 10 days. Supportive measures and OTC medications reviewed. Will refill her viscous lidocaine. Strict follow-up precautions discussed with patient who voiced understanding and agreement with the plan. She declines work note at present time.  Written instructions sent to patient's MyChart.  - lidocaine (XYLOCAINE) 2 % solution; Use as directed 15 mLs in the mouth or throat as needed for mouth pain. Apply to canker sores in mouth using cotton tipped applicator as needed.  Dispense: 100 mL; Refill: 0 - amoxicillin (AMOXIL) 500 MG capsule; Take 1 capsule (500 mg total) by mouth 2 (two) times daily.  Dispense: 20 capsule; Refill: 0    Piedad Climes, [place] 11/04/2020

## 2020-11-19 ENCOUNTER — Telehealth: Payer: BC Managed Care – PPO | Admitting: Physician Assistant

## 2020-11-19 DIAGNOSIS — J310 Chronic rhinitis: Secondary | ICD-10-CM

## 2020-11-19 DIAGNOSIS — L509 Urticaria, unspecified: Secondary | ICD-10-CM

## 2020-11-19 NOTE — Progress Notes (Signed)
Based on what you shared with me, I feel your condition warrants further evaluation and I recommend that you be seen in a face to face office visit. If you are having hives along with other allergy symptoms that are breaking through despite use of antihistamines, nasal steroids and your singulair (listed in your chart), you need to follow-up with your primary provider or allergist for further evaluation and management. If symptoms are severe and you are unable to get in touch with them, you need to be seen in person at local urgent care for evaluation.    NOTE: If you entered your credit card information for this eVisit, you will not be charged. You may see a "hold" on your card for the $35 but that hold will drop off and you will not have a charge processed.   If you are having a true medical emergency please call 911.      For an urgent face to face visit, [facility] has five urgent care centers for your convenience:     [facility] at [facility] Directions [phone] [address] [place], [place] [address] . 10 am - 6pm Monday - Friday    [facility]) Get Driving Directions [phone] [address] [place], [place] [address] . 10 am to 8 pm Monday-Friday . 12 pm to 8 pm [facility] Center [facility]) Get Driving Directions [phone]  [address] [place],  [place]  [address] . 8 am to 8 pm Monday-Friday . 8 am to 4 pm [facility] at [facility] Get Driving Directions [phone] [address] [place], [place] [address] . 8 am to 8 pm Monday-Friday . 9 am to 6 pm Saturday . 11 am to 6 pm Sunday   [facility] at [facility] : [facility] Get Driving Directions  [phone] [address] [place], [place] [address] . 8 am to 8 pm Monday-Friday . 8 am to 4 pm  [facility] Urgent Care at [facility] Directions [phone] [address] [place], [place] [address] . 12 pm to 6 pm Monday-Friday    VIDEO VISITS: [facility] is now providing on-demand video visits for your convenience. Speak to one of our providers from the comfort of your home 8 am to 8 pm or after hours through our partner vendor.   For Video Visit details and options:   [email or web address]      Your MyChart E-visit questionnaire answers were reviewed by a board certified advanced clinical practitioner to complete your personal care plan based on your specific symptoms.  Thank you for using e-Visits.

## 2020-11-24 ENCOUNTER — Ambulatory Visit (HOSPITAL_BASED_OUTPATIENT_CLINIC_OR_DEPARTMENT_OTHER): Payer: BC Managed Care – PPO | Admitting: Nurse Practitioner

## 2020-11-24 ENCOUNTER — Telehealth (HOSPITAL_BASED_OUTPATIENT_CLINIC_OR_DEPARTMENT_OTHER): Payer: Self-pay

## 2020-11-24 NOTE — Progress Notes (Deleted)
New Patient Office Visit  Subjective:  Patient ID: Alexandra Trujillo, female    DOB: [birth year]  Age: 30 y.o. MRN: [id number]  CC: No chief complaint on file.   HPI Alexandra Trujillo presents for ***  Past Medical History:  Diagnosis Date  . Allergy   . Anxiety   . Asthma   . COVID-19   . Hx of maternal laceration, 4th degree, currently pregnant 02/11/2014   1st pregnancy, no laceration on 3rd baby   . Menstrual migraine   . Uterine perforation by intrauterine contraceptive device     Past Surgical History:  Procedure Laterality Date  . IUD REMOVAL  2012   malposition  . LAPAROSCOPY     to find IUD & remove it    Family History  Problem Relation Age of Onset  . Thyroid disease Maternal Aunt   . Stroke Maternal Grandmother   . Hypertension Maternal Grandmother   . Asthma Mother   . Healthy Father     Social History   Socioeconomic History  . Marital status: Divorced    Spouse name: Not on file  . Number of children: Not on file  . Years of education: Not on file  . Highest education level: Not on file  Occupational History  . Not on file  Tobacco Use  . Smoking status: Never Smoker  . Smokeless tobacco: Never Used  Vaping Use  . Vaping Use: Never used  Substance and Sexual Activity  . Alcohol use: Not Currently  . Drug use: Never  . Sexual activity: Yes    Partners: Male    Birth control/protection: Pill  Other Topics Concern  . Not on file  Social History Narrative  . Not on file   Social Determinants of Health   Financial Resource Strain: Not on file  Food Insecurity: Not on file  Transportation Needs: Not on file  Physical Activity: Not on file  Stress: Not on file  Social Connections: Not on file  Intimate Partner Violence: Not on file    ROS Review of Systems  Objective:   Today's Vitals: There were no vitals taken for this visit.  Physical Exam  Assessment & Plan:   Problem List Items Addressed This Visit   None      Outpatient Encounter Medications as of 11/24/2020  Medication Sig  . albuterol (VENTOLIN HFA) 108 (90 Base) MCG/ACT inhaler Inhale 2 puffs into the lungs every 6 (six) hours as needed for wheezing or shortness of breath.  . escitalopram (LEXAPRO) 5 MG tablet Take 5 mg by mouth daily.  Marland Kitchen ibuprofen (ADVIL,MOTRIN) 200 MG tablet Take 400 mg by mouth every 6 (six) hours as needed for moderate pain.  Marland Kitchen levocetirizine (XYZAL) 5 MG tablet Take 1 tablet (5 mg total) by mouth every evening.  . lidocaine (XYLOCAINE) 2 % solution Use as directed 15 mLs in the mouth or throat as needed for mouth pain. Apply to canker sores in mouth using cotton tipped applicator as needed.  . Magnesium Oxide 500 MG CAPS Take 1 capsule (500 mg total) by mouth daily.  . montelukast (SINGULAIR) 10 MG tablet Take 1 tablet (10 mg total) by mouth at bedtime.  . rizatriptan (MAXALT-MLT) 5 MG disintegrating tablet Take 1 tablet (5 mg total) by mouth as needed for migraine. May repeat in 2 hours if needed. Needs appointment.   No facility-administered encounter medications on file as of 11/24/2020.    Follow-up: No follow-ups on file.   Sung Amabile  Alexandra Brower, NP

## 2020-11-24 NOTE — Patient Instructions (Incomplete)
Recommendations from today's visit:     Thank you for choosing [facility] for your Primary Care needs. I am excited for the opportunity to partner with you to meet your health care goals. It was a pleasure meeting you today!  I am an Adult-Geriatric Nurse Practitioner with a background in caring for patients for more than 20 years. I received my Bachelor of Science in Nursing and my Doctor of Nursing Practice degrees at [employer]. I received additional fellowship training in primary care and sports medicine after receiving my doctorate degree. I provide primary care and sports medicine services to patients age 13 and older within this office. I am also a provider with the [facility] and the director of the APP Fellowship with [place].  I am a [place] native, but have called the [place] area home for nearly 20 years and am proud to be a member of this community.   I am passionate about providing the best service to you through preventive medicine and supportive care. I consider you a part of the medical team and value your input. I work diligently to ensure that you are heard and your needs are met in a safe and effective manner. I want you to feel comfortable with me as your provider and want you to know that your health concerns are important to me.   For your information, our office hours are Monday- Friday 8:00 AM - 5:00 PM At this time I am not in the office on Wednesdays.  If you have questions or concerns, please call our office at [phone] or send us a MyChart message and we will respond as quickly as possible.   For all urgent or time sensitive needs we ask that you please call the office to avoid delays. MyChart is not constantly monitored and replies may take up to 72 business hours.  MyChart Policy: . MyChart allows for you to see your visit notes, after visit summary, provider recommendations,  lab and tests results, make an appointment, request refills, and contact your provider or the office for non-urgent questions or concerns.  . Providers are seeing patients during normal business hours and do not have built in time to review MyChart messages. We ask that you allow a minimum of 72 business hours for MyChart message responses.  . Complex MyChart concerns may require a visit. Your provider may request you schedule a virtual or in person visit to ensure we are providing the best care possible. . MyChart messages sent after 4:00 PM on Friday will not be received by the provider until Monday morning.    Lab and Test Results: . You will receive your lab and test results on MyChart as soon as they are completed and results have been sent by the lab or testing facility. Due to this service, you will receive your results BEFORE your provider.  . Please allow a minimum of 72 business hours for your provider to receive and review lab and test results and contact you about.   . Most lab and test result comments from the provider will be sent through MyChart. Your provider may recommend changes to the plan of care, follow-up visits, repeat testing, ask questions, or request an office visit to discuss these results. You may reply directly to this message or call the office at [phone] to provide information for the provider or set up an appointment. . In some instances, you will be   called with test results and recommendations. Please let us know if this is preferred and we will make note of this in your chart to provide this for you.    . If you have not heard a response to your lab or test results in 72 business hours, please call the office to let us know.   After Hours: . For all non-emergency after hours needs, please call the office at [phone] and select the option to reach the on-call provider service. On-call services are shared between multiple [facility] offices and therefore it will  not be possible to speak directly with your provider. On-call providers may provide medical advice and recommendations, but are unable to provide refills for maintenance medications.  . For all emergency or urgent medical needs after normal business hours, we recommend that you seek care at the closest Urgent Care or Emergency Department to ensure appropriate treatment in a timely manner.  . [facility] has a 24 hour emergency room located on the ground floor for your convenience.    Please do not hesitate to reach out to us with concerns.   Thank you, again, for choosing me as your health care partner. I appreciate your trust and look forward to learning more about you.   SaraBeth Early, DNP, AGNP-c ___________________________________________________________________________________  

## 2020-11-24 NOTE — Telephone Encounter (Signed)
Pt needs to establish care and is using [employer]

## 2020-11-28 ENCOUNTER — Ambulatory Visit (HOSPITAL_BASED_OUTPATIENT_CLINIC_OR_DEPARTMENT_OTHER): Payer: BC Managed Care – PPO | Admitting: Nurse Practitioner

## 2020-12-01 ENCOUNTER — Ambulatory Visit (HOSPITAL_BASED_OUTPATIENT_CLINIC_OR_DEPARTMENT_OTHER): Payer: BC Managed Care – PPO | Admitting: Family Medicine

## 2020-12-01 ENCOUNTER — Other Ambulatory Visit: Payer: Self-pay

## 2020-12-01 ENCOUNTER — Other Ambulatory Visit (HOSPITAL_BASED_OUTPATIENT_CLINIC_OR_DEPARTMENT_OTHER)
Admission: RE | Admit: 2020-12-01 | Discharge: 2020-12-01 | Disposition: A | Discharge status: Home / Self Care | Payer: BC Managed Care – PPO | Source: Ambulatory Visit | Attending: Family Medicine | Admitting: Family Medicine

## 2020-12-01 ENCOUNTER — Encounter (HOSPITAL_BASED_OUTPATIENT_CLINIC_OR_DEPARTMENT_OTHER): Payer: Self-pay | Admitting: Family Medicine

## 2020-12-01 VITALS — BP 122/76 | HR 66 | Ht <= 58 in | Wt 154.2 lb

## 2020-12-01 DIAGNOSIS — G43829 Menstrual migraine, not intractable, without status migrainosus: Secondary | ICD-10-CM

## 2020-12-01 DIAGNOSIS — J310 Chronic rhinitis: Secondary | ICD-10-CM | POA: Diagnosis not present

## 2020-12-01 DIAGNOSIS — Z7689 Persons encountering health services in other specified circumstances: Secondary | ICD-10-CM

## 2020-12-01 DIAGNOSIS — Z6832 Body mass index (BMI) 32.0-32.9, adult: Secondary | ICD-10-CM | POA: Insufficient documentation

## 2020-12-01 DIAGNOSIS — F419 Anxiety disorder, unspecified: Secondary | ICD-10-CM | POA: Insufficient documentation

## 2020-12-01 DIAGNOSIS — Z9109 Other allergy status, other than to drugs and biological substances: Secondary | ICD-10-CM

## 2020-12-01 LAB — COMPREHENSIVE METABOLIC PANEL
ALT: 16 U/L (ref 0–44)
AST: 18 U/L (ref 15–41)
Albumin: 4.3 g/dL (ref 3.5–5.0)
Alkaline Phosphatase: 54 U/L (ref 38–126)
Anion gap: 6 (ref 5–15)
BUN: 10 mg/dL (ref 6–20)
CO2: 28 mmol/L (ref 22–32)
Calcium: 9.1 mg/dL (ref 8.9–10.3)
Chloride: 104 mmol/L (ref 98–111)
Creatinine, Ser: 0.63 mg/dL (ref 0.44–1.00)
GFR, Estimated: >60 mL/min (ref >60)
Glucose, Bld: 82 mg/dL (ref 70–99)
Potassium: 3.7 mmol/L (ref 3.5–5.1)
Sodium: 138 mmol/L (ref 135–145)
Total Bilirubin: 0.4 mg/dL (ref 0.3–1.2)
Total Protein: 7.1 g/dL (ref 6.5–8.1)

## 2020-12-01 LAB — CBC WITH DIFFERENTIAL/PLATELET
Abs Immature Granulocytes: 0.03 10*3/uL (ref 0.00–0.07)
Basophils Absolute: 0.1 10*3/uL (ref 0.0–0.1)
Basophils Relative: 1 %
Eosinophils Absolute: 0.3 10*3/uL (ref 0.0–0.5)
Eosinophils Relative: 3 %
HCT: 37.1 % (ref 36.0–46.0)
Hemoglobin: 12.7 g/dL (ref 12.0–15.0)
Immature Granulocytes: 0 %
Lymphocytes Relative: 25 %
Lymphs Abs: 2 10*3/uL (ref 0.7–4.0)
MCH: 31.2 pg (ref 26.0–34.0)
MCHC: 34.2 g/dL (ref 30.0–36.0)
MCV: 91.2 fL (ref 80.0–100.0)
Monocytes Absolute: 0.7 10*3/uL (ref 0.1–1.0)
Monocytes Relative: 9 %
Neutro Abs: 4.9 10*3/uL (ref 1.7–7.7)
Neutrophils Relative %: 62 %
Platelets: 241 10*3/uL (ref 150–400)
RBC: 4.07 MIL/uL (ref 3.87–5.11)
RDW: 13 % (ref 11.5–15.5)
WBC: 7.9 10*3/uL (ref 4.0–10.5)
nRBC: 0 % (ref 0.0–0.2)

## 2020-12-01 LAB — LIPID PANEL
Cholesterol: 136 mg/dL (ref 0–200)
HDL: 27 mg/dL — ABNORMAL LOW (ref >40)
LDL Cholesterol: 71 mg/dL (ref 0–99)
Total CHOL/HDL Ratio: 5 RATIO
Triglycerides: 192 mg/dL — ABNORMAL HIGH (ref <150)
VLDL: 38 mg/dL (ref 0–40)

## 2020-12-01 MED ORDER — PROPRANOLOL HCL 10 MG PO TABS: 10.0000 mg | ORAL_TABLET | Freq: Two times a day (BID) | ORAL | 1 refills | Status: DC

## 2020-12-01 MED ORDER — ESCITALOPRAM OXALATE 20 MG PO TABS: 20.0000 mg | ORAL_TABLET | Freq: Every day | ORAL | 1 refills | Status: DC

## 2020-12-01 NOTE — Progress Notes (Signed)
New Patient Office Visit  Subjective:  Patient ID: Alexandra Trujillo, female    DOB: [birth year]  Age: 30 y.o. MRN: [id number]  CC:  Chief Complaint  Patient presents with  . Establish Care  . Migraine    Patient states she has migraines that are centered around her menstrual cycle  . Allergies    Patient has environmental and food allergies that she takes Xyzal and Singulair for but patient states that Zyzal is not working anymore and would like alternative therapy    HPI MILANNA Trujillo is a 30 year old female presenting to establish in clinic.  She has current concerns today regarding uncontrolled environmental allergies, needing refills for propranolol and escitalopram, and adequately controlled menstrual migraines.  Environmental allergies: Reports longstanding history of environmental allergies for which she has used various oral medications.  Indicates that she has tried intranasal sprays without notable improvement and with difficulty tolerating the sprays.  She has most recently been using Xyzal and Singulair.  Feels that she finds Singulair to be helpful, less benefit perceived from Xyzal.  Questions today regarding alternative options.  Anxiety: Reports being evaluated by psychiatrist in [facility] who she found through a web-based application called cerebral.  Initially she had been started on escitalopram with some improvement in symptoms -this medication was started in the summer 2021.  More recently, she had had an increase in anxiety symptoms and again was evaluated and started on propranolol.  She has noticed good control of symptoms with current regimen of propranolol and escitalopram.  Denies any other medications being prescribed in the past.  Denies prior work with therapist or undergoing CBT.  Indicates that she presently is sleeping well, feels rested during the day.  Menstrual migraines: Reports long history of migraines primarily associated with her menstrual cycles.   She will tend to have symptoms that occur a few days prior to the beginning of her menstrual cycle, will last during her cycle and into a few days following her cycle.  Overall it is about a 2-week period when she tends to have migraines.  Presently she is using NSAIDs as needed to help control symptoms, however these do not provide significant relief.  In the past, she has tried various triptans without significant relief, currently prescribed Maxalt which she does not note significant improvement with.  She has also seen OB/GYN in the past and was treated with hormonal therapy but did not note improvement in migraines with this.  Past Medical History:  Diagnosis Date  . Allergy   . Anxiety   . Asthma   . COVID-19   . Hx of maternal laceration, 4th degree, currently pregnant 02/11/2014   1st pregnancy, no laceration on 3rd baby   . Menstrual migraine   . PMDD (premenstrual dysphoric disorder)   . Uterine perforation by intrauterine contraceptive device     Past Surgical History:  Procedure Laterality Date  . IUD REMOVAL  2012   malposition  . LAPAROSCOPY     to find IUD & remove it    Family History  Problem Relation Age of Onset  . Thyroid disease Maternal Aunt   . Stroke Maternal Grandmother   . Hypertension Maternal Grandmother   . Asthma Mother   . Healthy Father     Social History   Socioeconomic History  . Marital status: Divorced    Spouse name: Not on file  . Number of children: Not on file  . Years of education: Not on file  .  Highest education level: Not on file  Occupational History  . Not on file  Tobacco Use  . Smoking status: Never Smoker  . Smokeless tobacco: Never Used  Vaping Use  . Vaping Use: Never used  Substance and Sexual Activity  . Alcohol use: Not Currently  . Drug use: Never  . Sexual activity: Yes    Partners: Male    Birth control/protection: Pill  Other Topics Concern  . Not on file  Social History Narrative  . Not on file   Social  Determinants of Health   Financial Resource Strain: Not on file  Food Insecurity: Not on file  Transportation Needs: Not on file  Physical Activity: Not on file  Stress: Not on file  Social Connections: Not on file  Intimate Partner Violence: Not on file    Objective:   Today's Vitals: BP 122/76   Pulse 66   Ht 4\' 10"  (1.473 m)   Wt 154 lb 3.2 oz (69.9 kg)   LMP 10/31/2020   SpO2 99%   BMI 32.23 kg/m   Physical Exam  Assessment & Plan:   Problem List Items Addressed This Visit      Cardiovascular and Mediastinum   Menstrual migraine without status migrainosus, not intractable    Chronic, not at goal Patient has had incomplete response to NSAIDs, triptans, hormonal therapy Given lack of response thus far, will refer to neurology for further evaluation and treatment recommendations      Relevant Medications   escitalopram (LEXAPRO) 20 MG tablet   propranolol (INDERAL) 10 MG tablet   Other Relevant Orders   Ambulatory referral to Neurology   CBC with Differential/Platelet (Completed)   Comprehensive metabolic panel (Completed)   Lipid panel (Completed)     Other   Environmental allergies    Chronic, not at goal Continue with Singulair Will stop Xyzal and start Allegra and monitor response If not controlled adequately, likely refer to Allergist      BMI 32.0-32.9,adult    Check labs - CBC, CMP, lipid panel Recommend lifestyle modifications - increased weekly physical activity, healthy dietary choices Monitor weight at future visits      Relevant Orders   CBC with Differential/Platelet (Completed)   Comprehensive metabolic panel (Completed)   Lipid panel (Completed)   Anxiety    Chronic, controlled with escitalopram and propranolol Will refill medications as above GAD 7 completed today with score of 3 Discussed recommendation of psychotherapy, patient defers at this time      Relevant Medications   escitalopram (LEXAPRO) 20 MG tablet   propranolol  (INDERAL) 10 MG tablet    Other Visit Diagnoses    Encounter to establish care    -  Primary   Relevant Orders   CBC with Differential/Platelet (Completed)   Comprehensive metabolic panel (Completed)   Lipid panel (Completed)   Chronic rhinitis       Relevant Orders   CBC with Differential/Platelet (Completed)   Comprehensive metabolic panel (Completed)   Lipid panel (Completed)      Outpatient Encounter Medications as of 12/01/2020  Medication Sig  . albuterol (VENTOLIN HFA) 108 (90 Base) MCG/ACT inhaler Inhale 2 puffs into the lungs every 6 (six) hours as needed for wheezing or shortness of breath.  12/03/2020 ibuprofen (ADVIL,MOTRIN) 200 MG tablet Take 400 mg by mouth every 6 (six) hours as needed for moderate pain.  Marland Kitchen lidocaine (XYLOCAINE) 2 % solution Use as directed 15 mLs in the mouth or throat as needed for mouth pain.  Apply to canker sores in mouth using cotton tipped applicator as needed.  . montelukast (SINGULAIR) 10 MG tablet Take 1 tablet (10 mg total) by mouth at bedtime.  . rizatriptan (MAXALT-MLT) 5 MG disintegrating tablet Take 1 tablet (5 mg total) by mouth as needed for migraine. May repeat in 2 hours if needed. Needs appointment.  . [DISCONTINUED] escitalopram (LEXAPRO) 20 MG tablet Take 20 mg by mouth daily.  . [DISCONTINUED] propranolol (INDERAL) 10 MG tablet Take 10 mg by mouth 2 (two) times daily.  Marland Kitchen escitalopram (LEXAPRO) 20 MG tablet Take 1 tablet (20 mg total) by mouth daily.  . propranolol (INDERAL) 10 MG tablet Take 1 tablet (10 mg total) by mouth 2 (two) times daily.  . [DISCONTINUED] levocetirizine (XYZAL) 5 MG tablet Take 1 tablet (5 mg total) by mouth every evening. (Patient not taking: Reported on 12/01/2020)  . [DISCONTINUED] Magnesium Oxide 500 MG CAPS Take 1 capsule (500 mg total) by mouth daily. (Patient not taking: Reported on 12/01/2020)   No facility-administered encounter medications on file as of 12/01/2020.    Follow-up: Return in about 6 weeks (around  01/12/2021) for Follow Up.   Ebelin Dillehay J De Peru, MD

## 2020-12-01 NOTE — Patient Instructions (Signed)
  Medication Instructions:  Your physician has recommended you make the following change in your medication:  -- STOP Xyzal -- START Allegra - Take 1 tablet by mouth daily  --If you need a refill on any your medications before your next appointment, please call your pharmacy first. If no refills are authorized on file call the office.--  Lab Work: Your physician has recommended that you have lab work today: CBC, CMP, Lipid Profile If you have labs (blood work) drawn today and your tests are completely normal, you will receive your results only by: Marland Kitchen MyChart Message (if you have MyChart) OR . A phone call from our staff. Please ensure you check your voicemail in the event that you authorized detailed messages to be left on a delegated number. If you have any lab test that is abnormal or we need to change your treatment, we will call you to review the results.  Referrals/Procedures/Imaging: A referral has been placed for you to Neurology. Someone from the scheduling department will be in contact with you in regards to coordinating your consultation. If you do not hear from any of the schedulers within 7-10 business days please give our office a call.  Follow-Up: Your next appointment:   Your physician recommends that you schedule a follow-up appointment in: 6 WEEKS with Dr. de Peru  Thanks for letting us be apart of your health journey!!  Primary Care and Sports Medicine   Dr. de Peru and Shawna Clamp, DNP, AGNP  We recommend signing up for the patient portal called "MyChart".  Sign up information is provided on this After Visit Summary.  MyChart is used to connect with patients for Virtual Visits (Telemedicine).  Patients are able to view lab/test results, encounter notes, upcoming appointments, etc.  Non-urgent messages can be sent to your provider as well.   To learn more about what you can do with MyChart, please visit --  [email or web address].

## 2020-12-01 NOTE — Assessment & Plan Note (Signed)
Chronic, not at goal Continue with Singulair Will stop Xyzal and start Allegra and monitor response If not controlled adequately, likely refer to Allergist

## 2020-12-01 NOTE — Assessment & Plan Note (Signed)
Chronic, not at goal Patient has had incomplete response to NSAIDs, triptans, hormonal therapy Given lack of response thus far, will refer to neurology for further evaluation and treatment recommendations

## 2020-12-01 NOTE — Assessment & Plan Note (Addendum)
Chronic, controlled with escitalopram and propranolol Will refill medications as above GAD 7 completed today with score of 3 Discussed recommendation of psychotherapy, patient defers at this time

## 2020-12-01 NOTE — Assessment & Plan Note (Signed)
Check labs - CBC, CMP, lipid panel Recommend lifestyle modifications - increased weekly physical activity, healthy dietary choices Monitor weight at future visits

## 2020-12-05 ENCOUNTER — Telehealth (HOSPITAL_BASED_OUTPATIENT_CLINIC_OR_DEPARTMENT_OTHER): Payer: Self-pay

## 2020-12-05 NOTE — Telephone Encounter (Signed)
-----   Message from Hosie Poisson Peru, MD sent at 12/02/2020  1:30 PM EDT ----- Normal red blood cell and white blood cell count with normal hemoglobin indicating no anemia currently.  Electrolytes, kidney function and liver function are all normal.  Total cholesterol is normal with normal "bad" cholesterol and slightly decreased "good" cholesterol.  Triglycerides are slightly elevated.  Recommendation for slightly abnormal cholesterol panel would initially be for lifestyle modification including gradual increase in weekly physical activity as well as focusing on healthy dietary choices including incorporation of fresh fruit and vegetables and limiting of processed foods.

## 2020-12-05 NOTE — Telephone Encounter (Signed)
Results released by Dr. de [place] and reviewed by patient via MyChart Instructed patient to contact the office with any questions or concerns.  

## 2020-12-15 ENCOUNTER — Encounter: Payer: BC Managed Care – PPO | Admitting: Obstetrics & Gynecology

## 2020-12-16 ENCOUNTER — Ambulatory Visit (HOSPITAL_BASED_OUTPATIENT_CLINIC_OR_DEPARTMENT_OTHER): Payer: BC Managed Care – PPO | Admitting: Family Medicine

## 2020-12-16 ENCOUNTER — Other Ambulatory Visit: Payer: Self-pay

## 2020-12-16 VITALS — BP 108/72 | HR 68 | Temp 98.2°F | Ht <= 58 in | Wt 153.2 lb

## 2020-12-16 DIAGNOSIS — J0100 Acute maxillary sinusitis, unspecified: Secondary | ICD-10-CM

## 2020-12-16 DIAGNOSIS — R0781 Pleurodynia: Secondary | ICD-10-CM

## 2020-12-16 DIAGNOSIS — Z9109 Other allergy status, other than to drugs and biological substances: Secondary | ICD-10-CM

## 2020-12-16 DIAGNOSIS — J329 Chronic sinusitis, unspecified: Secondary | ICD-10-CM | POA: Insufficient documentation

## 2020-12-16 MED ORDER — AMOXICILLIN 500 MG PO CAPS: 500.0000 mg | ORAL_CAPSULE | Freq: Three times a day (TID) | ORAL | 0 refills | Status: AC

## 2020-12-16 NOTE — Assessment & Plan Note (Signed)
Uncertain etiology, exam most consistent with MSK etiology Recommend continuing to monitor at this time, consider imaging if not improving as expected

## 2020-12-16 NOTE — Progress Notes (Signed)
    Procedures performed today:    None.  Independent interpretation of notes and tests performed by another provider:   None.  Brief History, Exam, Impression, and Recommendations:    Brooksie is a 30 year old female presenting for evaluation of sinus pressure, headaches, rib pain.  Reports that about 1 week ago she developed increased sinus pressure, sore throat and nasal drainage.  She now has developed more sinus congestion and less nasal drainage, continues to have sinus pressure.  Denies any cough, fevers, sweats.  She has also noticed over the last 2 to 3 days that she has had decreased appetite and some pain along lower left ribs laterally.  This pain feels like a "bruise".  Has been using intranasal decongestant spray with good relief.  Does state that she is aware she can only use this for 3 days.  BP 108/72   Pulse 68   Temp 98.2 F (36.8 C)   Ht 4\' 10"  (1.473 m)   Wt 153 lb 3.2 oz (69.5 kg)   LMP 12/03/2020   SpO2 98%   BMI 32.68 kg/m   30 year old female in no acute distress Erythematous turbinates with nasal secretions present, moderate tenderness to palpation over maxillary sinuses, less over frontal sinuses Moderate pharyngeal erythema, no exudates Lungs clear to auscultation bilaterally Mild tenderness along left inferior rib cage at mid axillary region  Sinusitis Acute, active Given duration of symptoms, transition/worsening of symptoms, discussed possible role of antibiotics versus symptomatic care and monitoring Will provide 7-day course of amoxicillin, advised that patient could continue to monitor symptoms for 2 to 3 days to see if improvement begins to occur and start antibiotics if not improving, or may choose to begin antibiotics at this time Discussed potential risks including side effects of amoxicillin, potential for development of antibiotic resistance as potential reasons for delaying initiation of antibiotics Discussed options for symptomatic treatment  including intranasal saline, intranasal glucocorticoids, Tylenol or ibuprofen to help with pain Plan for follow-up as needed  Environmental allergies On switching to Allegra at last office visit, patient noted worsening and allergic rhinitis, uncertain if worsening due to lack of improvement with Allegra or if it was the beginning stages of sinusitis Discussed possibility of referral to allergist, patient declines at this time  Rib pain Uncertain etiology, exam most consistent with MSK etiology Recommend continuing to monitor at this time, consider imaging if not improving as expected    ___________________________________________ Dequan Kindred de 26, MD, ABFM, CAQSM Primary Care and Sports Medicine [facility]

## 2020-12-16 NOTE — Patient Instructions (Signed)
  Medication Instructions:  Your physician has recommended you make the following change in your medication:  -- START Amoxicillin 500 mg - Take 1 tablet by mouth three times daily for 7 days --If you need a refill on any your medications before your next appointment, please call your pharmacy first. If no refills are authorized on file call the office.--  Follow-Up: Your next appointment:   Your physician recommends that you schedule a follow-up appointment in: AS NEEDED if symptoms worsen with Dr. de [place]  Thanks for letting us be apart of your health journey!!  Primary Care and Sports Medicine   Dr. de Peru and Shawna Clamp, DNP, AGNP  We recommend signing up for the patient portal called "MyChart".  Sign up information is provided on this After Visit Summary.  MyChart is used to connect with patients for Virtual Visits (Telemedicine).  Patients are able to view lab/test results, encounter notes, upcoming appointments, etc.  Non-urgent messages can be sent to your provider as well.   To learn more about what you can do with MyChart, please visit --  [email or web address].

## 2020-12-16 NOTE — Assessment & Plan Note (Addendum)
Acute, active Given duration of symptoms, transition/worsening of symptoms, discussed possible role of antibiotics versus symptomatic care and monitoring Will provide 7-day course of amoxicillin, advised that patient could continue to monitor symptoms for 2 to 3 days to see if improvement begins to occur and start antibiotics if not improving, or may choose to begin antibiotics at this time Discussed potential risks including side effects of amoxicillin, potential for development of antibiotic resistance as potential reasons for delaying initiation of antibiotics Discussed options for symptomatic treatment including intranasal saline, intranasal glucocorticoids, Tylenol or ibuprofen to help with pain Plan for follow-up as needed

## 2020-12-16 NOTE — Assessment & Plan Note (Signed)
On switching to Allegra at last office visit, patient noted worsening and allergic rhinitis, uncertain if worsening due to lack of improvement with Allegra or if it was the beginning stages of sinusitis Discussed possibility of referral to allergist, patient declines at this time

## 2020-12-18 ENCOUNTER — Emergency Department
Admission: EM | Admit: 2020-12-18 | Discharge: 2020-12-18 | Disposition: A | Discharge status: Home / Self Care | Payer: BC Managed Care – PPO | Source: Home / Self Care | Attending: Family Medicine | Admitting: Family Medicine

## 2020-12-18 ENCOUNTER — Other Ambulatory Visit: Payer: Self-pay

## 2020-12-18 ENCOUNTER — Emergency Department (INDEPENDENT_AMBULATORY_CARE_PROVIDER_SITE_OTHER): Payer: BC Managed Care – PPO

## 2020-12-18 DIAGNOSIS — R1012 Left upper quadrant pain: Secondary | ICD-10-CM

## 2020-12-18 DIAGNOSIS — R109 Unspecified abdominal pain: Secondary | ICD-10-CM | POA: Diagnosis not present

## 2020-12-18 LAB — POCT URINALYSIS DIP (MANUAL ENTRY)
Bilirubin, UA: NEGATIVE
Blood, UA: NEGATIVE
Glucose, UA: NEGATIVE mg/dL
Ketones, POC UA: NEGATIVE mg/dL
Leukocytes, UA: NEGATIVE
Nitrite, UA: NEGATIVE
Protein Ur, POC: NEGATIVE mg/dL
Spec Grav, UA: 1.01 (ref 1.010–1.025)
Urobilinogen, UA: 4 E.U./dL — AB
pH, UA: 6.5 (ref 5.0–8.0)

## 2020-12-18 MED ORDER — TRAMADOL-ACETAMINOPHEN 37.5-325 MG PO TABS: 1.0000 | ORAL_TABLET | Freq: Four times a day (QID) | ORAL | 0 refills | Status: DC | PRN

## 2020-12-18 NOTE — Discharge Instructions (Signed)
Please try to drink lots of water and Finish your antibiotic Continue allergy medicines Take Ultracet as needed for pain Follow-up with your doctor if not improving by next week

## 2020-12-18 NOTE — ED Triage Notes (Signed)
Pt c/o LT flank pain that started Sunday. Constant dull ache, but some bouts of sharp shooting pain. Somewhat supressed appetite. BMs less frequent and slightly strained. Fever Tues afternoon 100.2. Pain 3/10 Heat pad prn. Currently on abx for sinus infection.

## 2020-12-18 NOTE — ED Provider Notes (Signed)
[facility]    CSN: [id number] Arrival date & time: 12/18/20  1310      History   Chief Complaint Chief Complaint  Patient presents with  . Back Pain  . Flank Pain    LT    HPI Alexandra Trujillo is a 30 y.o. female.   HPI Patient states that she went to a dentist X-rays of the have sinusitis on films.  At the time she did not feel better.  She was started on antibiotics.  She still has some sinus congestion and pain.  She is here complaining of left flank pain.  She states that that is been present since Sunday, 4 days ago.  Decreased appetite.  Nausea no vomiting.  No diarrhea.  Last bowel movement was today.  She states that she has a sharp shooting pain around the left flank and into the abdomen.  She states she did have fever to 102.  She is also has some headaches and body aches.  No urinary symptoms.  No urinary frequency.  Past Medical History:  Diagnosis Date  . Allergy   . Anxiety   . Asthma   . COVID-19   . Hx of maternal laceration, 4th degree, currently pregnant 02/11/2014   1st pregnancy, no laceration on 3rd baby   . Menstrual migraine   . PMDD (premenstrual dysphoric disorder)   . 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 Sinusitis 12/16/2020  . Rib pain 12/16/2020  . BMI 32.0-32.9,adult 12/01/2020  . Anxiety 12/01/2020  . Environmental allergies 04/26/2018  . Aphthous ulcer of mouth 04/26/2018  . Perennial allergic rhinitis 04/26/2018  . Menstrual migraine without status migrainosus, not intractable 04/26/2018  . Rubella non-immune status, antepartum 02/11/2014    Past Surgical History:  Procedure Laterality Date  . IUD REMOVAL  2012   malposition  . LAPAROSCOPY     to find IUD & remove it    OB History    Gravida  4   Para  4   Term  4   Preterm      AB      Living  4     SAB      IAB      Ectopic      Multiple  0   Live Births  4             Home Medications    Prior to Admission medications   Medication Sig Start Date End Date Taking? Authorizing Provider  traMADol-acetaminophen (ULTRACET) 37.5-325 MG tablet Take 1-2 tablets by mouth every 6 (six) hours as needed. 12/18/20  Yes Eustace Moore, MD  albuterol (VENTOLIN HFA) 108 (90 Base) MCG/ACT inhaler Inhale 2 puffs into the lungs every 6 (six) hours as needed for wheezing or shortness of breath. 06/18/20   Junie Spencer, FNP  amoxicillin (AMOXIL) 500 MG capsule Take 1 capsule (500 mg total) by mouth 3 (three) times daily for 7 days. 12/16/20 12/23/20  de [place], Raymond J, MD  escitalopram (LEXAPRO) 20 MG tablet Take 1 tablet (20 mg total) by mouth daily. 12/01/20   de [place], Buren Kos, MD  ibuprofen (ADVIL,MOTRIN) 200 MG tablet Take 400 mg by mouth every 6 (six) hours as needed for moderate pain.    [provider]  levocetirizine (XYZAL) 5 MG tablet Take 5 mg by mouth every evening.    [provider]  lidocaine (  XYLOCAINE) 2 % solution Use as directed 15 mLs in the mouth or throat as needed for mouth pain. Apply to canker sores in mouth using cotton tipped applicator as needed. 11/04/20   Waldon Merl, PA-C  montelukast (SINGULAIR) 10 MG tablet Take 1 tablet (10 mg total) by mouth at bedtime. 02/08/20   Christen Butter, NP  propranolol (INDERAL) 10 MG tablet Take 1 tablet (10 mg total) by mouth 2 (two) times daily. 12/01/20   de [place], Buren Kos, MD  rizatriptan (MAXALT-MLT) 5 MG disintegrating tablet Take 1 tablet (5 mg total) by mouth as needed for migraine. May repeat in 2 hours if needed. Needs appointment. 10/22/20   Christen Butter, NP    Family History Family History  Problem Relation Age of Onset  . Thyroid disease Maternal Aunt   . Stroke Maternal Grandmother   . Hypertension Maternal Grandmother   . Asthma Mother   . Healthy Father     Social History Social History   Tobacco Use  . Smoking status: Never Smoker  . Smokeless tobacco: Never  Used  Vaping Use  . Vaping Use: Never used  Substance Use Topics  . Alcohol use: Not Currently  . Drug use: Never     Allergies   Tree extract and Peanut-containing drug products   Review of Systems Review of Systems See HPI  Physical Exam Triage Vital Signs ED Triage Vitals  Enc Vitals Group     BP 12/18/20 1323 129/80     Pulse Rate 12/18/20 1323 73     Resp 12/18/20 1323 18     Temp 12/18/20 1323 98.4 F (36.9 C)     Temp Source 12/18/20 1323 Oral     SpO2 12/18/20 1323 98 %     Weight --      Height --      Head Circumference --      Peak Flow --      Pain Score 12/18/20 1324 3     Pain Loc --      Pain Edu? --      Excl. in GC? --    No data found.  Updated Vital Signs BP 129/80 (BP Location: Right Arm)   Pulse 73   Temp 98.4 F (36.9 C) (Oral)   Resp 18   LMP 12/03/2020   SpO2 98%      Physical Exam Constitutional:      General: She is not in acute distress.    Appearance: Normal appearance. She is well-developed and normal weight.  HENT:     Head: Normocephalic and atraumatic.     Right Ear: Tympanic membrane normal.     Left Ear: Tympanic membrane and ear canal normal.     Ears:     Comments: Mask is in place    Nose: Nose normal. No congestion.     Mouth/Throat:     Pharynx: No posterior oropharyngeal erythema.  Eyes:     Conjunctiva/sclera: Conjunctivae normal.     Pupils: Pupils are equal, round, and reactive to light.  Cardiovascular:     Rate and Rhythm: Normal rate and regular rhythm.     Heart sounds: Normal heart sounds.  Pulmonary:     Effort: Pulmonary effort is normal. No respiratory distress.     Breath sounds: Normal breath sounds.  Chest:     Chest wall: No tenderness.  Abdominal:     General: There is no distension.     Palpations: Abdomen is soft.  Tenderness: There is abdominal tenderness.     Comments: Tenderness to deep palpation of the left mid abdomen.  No rib or costochondral tenderness.  No organomegaly or  masses  Musculoskeletal:        General: Normal range of motion.     Cervical back: Normal range of motion.  Skin:    General: Skin is warm and dry.  Neurological:     Mental Status: She is alert.      UC Treatments / Results  Labs (all labs ordered are listed, but only abnormal results are displayed) Labs Reviewed  POCT URINALYSIS DIP (MANUAL ENTRY) - Abnormal; Notable for the following components:      Result Value   Urobilinogen, UA 4.0 (*)    All other components within normal limits    EKG   Radiology DG Abdomen Acute W/Chest  Result Date: 12/18/2020 CLINICAL DATA:  Left upper quadrant pain. EXAM: DG ABDOMEN ACUTE WITH 1 VIEW CHEST COMPARISON:  None. FINDINGS: There is no evidence of dilated bowel loops or free intraperitoneal air. No radiopaque calculi overlying the urinary tract are identified. Phleboliths are seen in the pelvis. Heart size and mediastinal contours are within normal limits. Both lungs are clear. IMPRESSION: Negative abdominal radiographs.  No acute cardiopulmonary disease. Electronically Signed   By: Romona Curls M.D.   On: 12/18/2020 14:53    Procedures Procedures (including critical care time)  Medications Ordered in UC Medications - No data to display  Initial Impression / Assessment and Plan / UC Course  I have reviewed the triage vital signs and the nursing notes.  Pertinent labs & imaging results that were available during my care of the patient were reviewed by me and considered in my medical decision making (see chart for details).     *Patient has pain around her left lower ribs and abdomen.  Could be musculoskeletal.  I do not see any significant abdominal process going on.  X-rays are negative.  Urinalysis negative.  Exam shows some tenderness but not an acute abdomen.  Will watch conservatively. Final Clinical Impressions(s) / UC Diagnoses   Final diagnoses:  Acute left flank pain     Discharge Instructions     Please try to  drink lots of water and Finish your antibiotic Continue allergy medicines Take Ultracet as needed for pain Follow-up with your doctor if not improving by next week    ED Prescriptions    Medication Sig Dispense Auth. Provider   traMADol-acetaminophen (ULTRACET) 37.5-325 MG tablet Take 1-2 tablets by mouth every 6 (six) hours as needed. 20 tablet Eustace Moore, MD     I have reviewed the PDMP during this encounter.   Eustace Moore, MD 12/18/20 [phone]

## 2020-12-29 ENCOUNTER — Encounter: Payer: BC Managed Care – PPO | Admitting: Obstetrics & Gynecology

## 2020-12-30 ENCOUNTER — Encounter: Payer: Self-pay | Admitting: Physician Assistant

## 2020-12-30 ENCOUNTER — Telehealth: Payer: BC Managed Care – PPO | Admitting: Physician Assistant

## 2020-12-30 DIAGNOSIS — H1033 Unspecified acute conjunctivitis, bilateral: Secondary | ICD-10-CM

## 2020-12-30 MED ORDER — OFLOXACIN 0.3 % OP SOLN: 1.0000 [drp] | Freq: Four times a day (QID) | OPHTHALMIC | 0 refills | Status: DC

## 2020-12-30 NOTE — Patient Instructions (Addendum)
1. Acute bacterial conjunctivitis of both eyes  - wear glasses until symptoms have fully resolved.  - ofloxacin ophthalmic drops  - ophthalmology follow-up in 24-48 hours if no improvement or sooner if symptoms worsen.   Bacterial Conjunctivitis, Adult Bacterial conjunctivitis is an infection of the clear membrane that covers the white part of your eye and the inner surface of your eyelid (conjunctiva). When the blood vessels in your conjunctiva become inflamed, your eye becomes red or pink, and it will probably feel itchy. Bacterial conjunctivitis spreads very easily from person to person (is contagious). It also spreads easily from one eye to the other eye. What are the causes? This condition is caused by bacteria. You may get the infection if you come into close contact with:  A person who is infected with the bacteria.  Items that are contaminated with the bacteria, such as a face towel, contact lens solution, or eye makeup. What increases the risk? You are more likely to develop this condition if you:  Are exposed to other people who have the infection.  Wear contact lenses.  Have a sinus infection.  Have had a recent eye injury or surgery.  Have a weak body defense system (immune system).  Have a medical condition that causes dry eyes. What are the signs or symptoms? Symptoms of this condition include:  Thick, yellowish discharge from the eye. This may turn into a crust on the eyelid overnight and cause your eyelids to stick together.  Tearing or watery eyes.  Itchy eyes.  Burning feeling in your eyes.  Eye redness.  Swollen eyelids.  Blurred vision.   How is this diagnosed? This condition is diagnosed based on your symptoms and medical history. Your health care provider may also take a sample of discharge from your eye to find the cause of your infection. This is rarely done. How is this treated? This condition may be treated with:  Antibiotic eye drops or  ointment to clear the infection more quickly and prevent the spread of infection to others.  Oral antibiotic medicines to treat infections that do not respond to drops or ointments or that last longer than 10 days.  Cool, wet cloths (cool compresses) placed on the eyes.  Artificial tears applied 2-6 times a day.   Follow these instructions at home: Medicines  Take or apply your antibiotic medicine as told by your health care provider. Do not stop taking or applying the antibiotic even if you start to feel better.  Take or apply over-the-counter and prescription medicines only as told by your health care provider.  Be very careful to avoid touching the edge of your eyelid with the eye-drop bottle or the ointment tube when you apply medicines to the affected eye. This will keep you from spreading the infection to your other eye or to other people. Managing discomfort  Gently wipe away any drainage from your eye with a warm, wet washcloth or a cotton ball.  Apply a clean, cool compress to your eye for 10-20 minutes, 3-4 times a day. General instructions  Do not wear contact lenses until the inflammation is gone and your health care provider says it is safe to wear them again. Ask your health care provider how to sterilize or replace your contact lenses before you use them again. Wear glasses until you can resume wearing contact lenses.  Avoid wearing eye makeup until the inflammation is gone. Throw away any old eye cosmetics that may be contaminated.  Change or wash  your pillowcase every day.  Do not share towels or washcloths. This may spread the infection.  Wash your hands often with soap and water. Use paper towels to dry your hands.  Avoid touching or rubbing your eyes.  Do not drive or use heavy machinery if your vision is blurred. Contact a health care provider if:  You have a fever.  Your symptoms do not get better after 10 days. Get help right away if you have:  A fever  and your symptoms suddenly get worse.  Severe pain when you move your eye.  Facial pain, redness, or swelling.  Sudden loss of vision. Summary  Bacterial conjunctivitis is an infection of the clear membrane that covers the white part of your eye and the inner surface of your eyelid (conjunctiva).  Bacterial conjunctivitis spreads very easily from person to person (is contagious).  Wash your hands often with soap and water. Use paper towels to dry your hands.  Take or apply your antibiotic medicine as told by your health care provider. Do not stop taking or applying the antibiotic even if you start to feel better.  Contact a health care provider if you have a fever or your symptoms do not get better after 10 days. This information is not intended to replace advice given to you by your health care provider. Make sure you discuss any questions you have with your health care provider. Document Revised: 11/14/2018 Document Reviewed: 03/01/2018 Elsevier Patient Education  2021 [employer].

## 2020-12-30 NOTE — Progress Notes (Signed)
Alexandra Trujillo, farrelly are scheduled for a virtual visit with your provider today.    Just as we do with appointments in the office, we must obtain your consent to participate.  Your consent will be active for this visit and any virtual visit you may have with one of our providers in the next 365 days.    If you have a MyChart account, I can also send a copy of this consent to you electronically.  All virtual visits are billed to your insurance company just like a traditional visit in the office.  As this is a virtual visit, video technology does not allow for your provider to perform a traditional examination.  This may limit your provider's ability to fully assess your condition.  If your provider identifies any concerns that need to be evaluated in person or the need to arrange testing such as labs, EKG, etc, we will make arrangements to do so.    Although advances in technology are sophisticated, we cannot ensure that it will always work on either your end or our end.  If the connection with a video visit is poor, we may have to switch to a telephone visit.  With either a video or telephone visit, we are not always able to ensure that we have a secure connection.   I need to obtain your verbal consent now.   Are you willing to proceed with your visit today?   Alexandra Trujillo has provided verbal consent on 12/30/2020 for a virtual visit (video or telephone).   Alexandra Forth, PA-C 12/30/2020  3:16 PM   Date:  12/30/2020   ID:  [name], DOB [birth year], MRN [id number]  Patient Location: Home Provider Location: Home Office   Participants: Patient and Provider for Visit and Wrap up  Method of visit: Video  Location of Patient: Home Location of Provider: Home Office Consent was obtain for visit over the video. Services rendered by provider: Visit was performed via video  A video enabled telemedicine application was used and I verified that I am speaking with the correct person  using two identifiers.  PCP:  de [place], Raymond J, MD   Chief Complaint:  Caron Presume eye  History of Present Illness:    Alexandra Trujillo is a 30 y.o. female with history as stated below. Presents video telehealth for an acute care visit  Pt reports both eyes are irritated but the right eye is the worst.  2 days of symptoms with yellow discharge from the right eye.  Pt does wear contacts; has discarded these.  No pain, no swelling.  No fever.  No changes in vision from baseline. No modifying factors.   No other URI symptoms.  The patient does not have symptoms concerning for COVID-19 infection (fever, chills, cough, or new shortness of breath).  Patient has not been tested for COVID during this illness.  Past Medical, Surgical, Social History, Allergies, and Medications have been Reviewed.  Patient Active Problem List   Diagnosis Date Noted  . Sinusitis 12/16/2020  . Rib pain 12/16/2020  . BMI 32.0-32.9,adult 12/01/2020  . Anxiety 12/01/2020  . Environmental allergies 04/26/2018  . Aphthous ulcer of mouth 04/26/2018  . Perennial allergic rhinitis 04/26/2018  . Menstrual migraine without status migrainosus, not intractable 04/26/2018  . Rubella non-immune status, antepartum 02/11/2014    Social History   Tobacco Use  . Smoking status: Never Smoker  . Smokeless tobacco: Never Used  Substance Use Topics  . Alcohol use: Not Currently  Current Outpatient Medications:  .  ofloxacin (OCUFLOX) 0.3 % ophthalmic solution, Place 1 drop into both eyes 4 (four) times daily., Disp: 5 mL, Rfl: 0 .  albuterol (VENTOLIN HFA) 108 (90 Base) MCG/ACT inhaler, Inhale 2 puffs into the lungs every 6 (six) hours as needed for wheezing or shortness of breath., Disp: 8 g, Rfl: 0 .  escitalopram (LEXAPRO) 20 MG tablet, Take 1 tablet (20 mg total) by mouth daily., Disp: 90 tablet, Rfl: 1 .  ibuprofen (ADVIL,MOTRIN) 200 MG tablet, Take 400 mg by mouth every 6 (six) hours as needed for moderate pain.,  Disp: , Rfl:  .  levocetirizine (XYZAL) 5 MG tablet, Take 5 mg by mouth every evening., Disp: , Rfl:  .  lidocaine (XYLOCAINE) 2 % solution, Use as directed 15 mLs in the mouth or throat as needed for mouth pain. Apply to canker sores in mouth using cotton tipped applicator as needed., Disp: 100 mL, Rfl: 0 .  montelukast (SINGULAIR) 10 MG tablet, Take 1 tablet (10 mg total) by mouth at bedtime., Disp: 90 tablet, Rfl: 3 .  propranolol (INDERAL) 10 MG tablet, Take 1 tablet (10 mg total) by mouth 2 (two) times daily., Disp: 180 tablet, Rfl: 1 .  rizatriptan (MAXALT-MLT) 5 MG disintegrating tablet, Take 1 tablet (5 mg total) by mouth as needed for migraine. May repeat in 2 hours if needed. Needs appointment., Disp: 10 tablet, Rfl: 0 .  traMADol-acetaminophen (ULTRACET) 37.5-325 MG tablet, Take 1-2 tablets by mouth every 6 (six) hours as needed., Disp: 20 tablet, Rfl: 0   Allergies  Allergen Reactions  . Tree Extract Anaphylaxis, Itching and Other (See Comments)    Plant based food, etc  . Peanut-Containing Drug Products Other (See Comments)    Headache, sores in mouth     Review of Systems  Constitutional: Negative for chills and fever.  HENT: Negative for congestion, ear pain and sore throat.   Eyes: Positive for discharge and redness. Negative for blurred vision and double vision.  Respiratory: Negative for cough, shortness of breath and wheezing.   Cardiovascular: Negative for chest pain, palpitations and leg swelling.  Gastrointestinal: Negative for abdominal pain, diarrhea, nausea and vomiting.  Genitourinary: Negative for dysuria.  Musculoskeletal: Negative for myalgias.  Skin: Negative for rash.  Neurological: Negative for loss of consciousness, weakness and headaches.  Psychiatric/Behavioral: The patient is not nervous/anxious.    See HPI for history of present illness.  Physical Exam Constitutional:      General: She is not in acute distress.    Appearance: Normal appearance.  She is not ill-appearing.  HENT:     Head: Normocephalic and atraumatic.     Nose: No congestion.  Eyes:     Extraocular Movements: Extraocular movements intact.     Conjunctiva/sclera:     Right eye: Right conjunctiva is injected.     Left eye: Left conjunctiva is injected.  Pulmonary:     Effort: Pulmonary effort is normal.  Musculoskeletal:        General: Normal range of motion.     Cervical back: Normal range of motion.  Skin:    Coloration: Skin is not pale.  Neurological:     General: No focal deficit present.     Mental Status: She is alert. Mental status is at baseline.  Psychiatric:        Mood and Affect: Mood normal.               A&P  1. Acute bacterial  conjunctivitis of both eyes  - wear glasses until symptoms have fully resolved.  - ofloxacin ophthalmic drops  - ophthalmology follow-up in 24-48 hours if no improvement or sooner if symptoms worsen.   Patient voiced understanding and agreement to plan.   Time:   Today, I have spent 15 minutes with the patient with telehealth technology discussing the above problems, reviewing the chart, previous notes, medications and orders.    Tests Ordered: No orders of the defined types were placed in this encounter.   Medication Changes: Meds ordered this encounter  Medications  . ofloxacin (OCUFLOX) 0.3 % ophthalmic solution    Sig: Place 1 drop into both eyes 4 (four) times daily.    Dispense:  5 mL    Refill:  0     Disposition:  Follow up Opthalmology as needed  Yetta Barre, PA-C  12/30/2020 3:25 PM

## 2021-01-12 ENCOUNTER — Ambulatory Visit (HOSPITAL_BASED_OUTPATIENT_CLINIC_OR_DEPARTMENT_OTHER): Payer: BC Managed Care – PPO | Admitting: Family Medicine

## 2021-01-12 ENCOUNTER — Other Ambulatory Visit: Payer: Self-pay

## 2021-01-12 ENCOUNTER — Encounter (HOSPITAL_BASED_OUTPATIENT_CLINIC_OR_DEPARTMENT_OTHER): Payer: Self-pay | Admitting: Family Medicine

## 2021-01-12 VITALS — BP 112/80 | HR 78 | Ht <= 58 in | Wt 152.8 lb

## 2021-01-12 DIAGNOSIS — J0100 Acute maxillary sinusitis, unspecified: Secondary | ICD-10-CM | POA: Diagnosis not present

## 2021-01-12 DIAGNOSIS — L089 Local infection of the skin and subcutaneous tissue, unspecified: Secondary | ICD-10-CM | POA: Diagnosis not present

## 2021-01-12 MED ORDER — SULFAMETHOXAZOLE-TRIMETHOPRIM 800-160 MG PO TABS: 1.0000 | ORAL_TABLET | Freq: Two times a day (BID) | ORAL | 0 refills | Status: AC

## 2021-01-12 NOTE — Patient Instructions (Signed)
  Medication Instructions:  Your physician has recommended you make the following change in your medication:  -- START Barctrim DS 800-160 - Take 1 tablet by mouth twice daily for 10 days-- RX SENT --If you need a refill on any your medications before your next appointment, please call your pharmacy first. If no refills are authorized on file call the office.--  Follow-Up: Your next appointment:   Your physician recommends that you schedule a follow-up appointment on THURSDAY with Dr. de Peru  Thanks for letting us be apart of your health journey!!  Primary Care and Sports Medicine   Dr. de Peru and Shawna Clamp, DNP, AGNP  We recommend signing up for the patient portal called "MyChart".  Sign up information is provided on this After Visit Summary.  MyChart is used to connect with patients for Virtual Visits (Telemedicine).  Patients are able to view lab/test results, encounter notes, upcoming appointments, etc.  Non-urgent messages can be sent to your provider as well.   To learn more about what you can do with MyChart, please visit --  [email or web address].

## 2021-01-12 NOTE — Progress Notes (Signed)
    Procedures performed today:    None.  Independent interpretation of notes and tests performed by another provider:   None.  Brief History, Exam, Impression, and Recommendations:    Alexandra Trujillo is a 30 year old female presenting for follow-up.  Sinusitis: Reports that symptoms have greatly improved since last visit.  Still with typical allergy symptoms, adequately controlled with OTC medications.  Skin infection: First noticed a bump over her right posterior calf about 3 days ago.  This continue to be more prominent and developed a small head.  She popped this and noted on Sunday she had increasing swelling and localized spreading of redness and warmth.  Has continued oozing from the wound, primarily white, clear and bloody at times.  Thinks she might have been bitten by an insect.  Denies prior similar issues.  Denies any systemic symptoms such as fevers, chills, night sweats.  BP 112/80   Pulse 78   Ht 4\' 10"  (1.473 m)   Wt 152 lb 12.8 oz (69.3 kg)   LMP 12/29/2020   SpO2 98%   BMI 31.94 kg/m   Right posterior calf with moderate erythema and redness centrally over calf.  Small subcentimeter opening in the skin.  Moderate induration and area of erythema.  Apply pressure leading to expression of small amount of purulent material and serous fluid.  Moderate pain with palpation over the area.  Blanching present.  Sinusitis Symptoms improved since last visit Continue with OTC medications for management of baseline allergic rhinitis  Skin infection Suspect local infection related to MRSA, less likely insect bite Given extent of localized redness and erythema suggesting cellulitis, will treat with oral antibiotics, specifically Bactrim DS Will have patient follow-up in 2 to 3 days to monitor response Will also reassess for drainable fluid collection at that time Advised on returning to the clinic earlier for any worsening symptoms or if she has any concerns  Plan for follow-up in 2 to  3 days to monitor treatment of infection   ___________________________________________ Alpha Mysliwiec de 12/31/2020, MD, ABFM, CAQSM Primary Care and Sports Medicine [facility]

## 2021-01-12 NOTE — Assessment & Plan Note (Signed)
Symptoms improved since last visit Continue with OTC medications for management of baseline allergic rhinitis

## 2021-01-12 NOTE — Assessment & Plan Note (Signed)
Suspect local infection related to MRSA, less likely insect bite Given extent of localized redness and erythema suggesting cellulitis, will treat with oral antibiotics, specifically Bactrim DS Will have patient follow-up in 2 to 3 days to monitor response Will also reassess for drainable fluid collection at that time Advised on returning to the clinic earlier for any worsening symptoms or if she has any concerns

## 2021-01-15 ENCOUNTER — Encounter (HOSPITAL_BASED_OUTPATIENT_CLINIC_OR_DEPARTMENT_OTHER): Payer: Self-pay | Admitting: Family Medicine

## 2021-01-15 ENCOUNTER — Ambulatory Visit (HOSPITAL_BASED_OUTPATIENT_CLINIC_OR_DEPARTMENT_OTHER): Payer: BC Managed Care – PPO | Admitting: Family Medicine

## 2021-01-15 ENCOUNTER — Other Ambulatory Visit: Payer: Self-pay

## 2021-01-15 VITALS — BP 124/80 | HR 88 | Ht <= 58 in | Wt 152.8 lb

## 2021-01-15 DIAGNOSIS — L089 Local infection of the skin and subcutaneous tissue, unspecified: Secondary | ICD-10-CM | POA: Diagnosis not present

## 2021-01-15 DIAGNOSIS — L0291 Cutaneous abscess, unspecified: Secondary | ICD-10-CM | POA: Diagnosis not present

## 2021-01-15 NOTE — Assessment & Plan Note (Signed)
Continues to have drainage from infection, suspect underlying abscess.  Discussed options with patient, proceeded with incision and drainage as noted above.  There was a small opening where drainage was already occurring, this was extended to allow for cavity exploration, further expression of purulent material. Given surrounding soft tissue infection, continue with antibiotics as above No wound packing given relatively small size of cavity Continue with covering wound with gauze and paper tape to allow for continued drainage

## 2021-01-15 NOTE — Assessment & Plan Note (Signed)
Still with redness, oozing of posterior calf Has been taking Bactrim without issue Denies any systemic symptoms such as fevers, chills, night sweats Keeping area covered with gauze and paper tape Continue with Bactrim at this time Further management of abscess as discussed separately

## 2021-01-15 NOTE — Progress Notes (Signed)
    Procedures performed today:    Abscess incision and drainage Performed by: Aneshia Jacquet de [place], MD Verbal informed consent obtained, risks and benefits discussed Timeout taken to confirm patient, procedure, equipment, site/side Location details: Right posterior calf Anesthesia: Local infiltration with lidocaine 1% with epinephrine Anesthetic total: 1.5 mL Description: Patient presented with abscess on right posterior calf.  Anesthesia of the site was completed as above.  Using an 11 blade, existing wound opening was extended superiorly to allow for adequate drainage and wound exploration.  Moderate amount of purulent material was expressed.  Abscess was explored using a cotton Q-tip. Cavity with depth of about 1 cm.  Due to small size of cavity, wound was not packed.  Wound was covered with 4 x 4 gauze and paper tape to protect the area and allow for continued drainage.  Patient was instructed on proper care and precautions.  Blood loss minimal.  Patient tolerated procedure extremely well with no immediate complications.  Independent interpretation of notes and tests performed by another provider:   None.  Brief History, Exam, Impression, and Recommendations:    BP 124/80   Pulse 88   Ht 4\' 10"  (1.473 m)   Wt 152 lb 12.8 oz (69.3 kg)   LMP 12/29/2020   SpO2 98%   BMI 31.94 kg/m   Skin infection Still with redness, oozing of posterior calf Has been taking Bactrim without issue Denies any systemic symptoms such as fevers, chills, night sweats Keeping area covered with gauze and paper tape Continue with Bactrim at this time Further management of abscess as discussed separately  Abscess Continues to have drainage from infection, suspect underlying abscess.  Discussed options with patient, proceeded with incision and drainage as noted above.  There was a small opening where drainage was already occurring, this was extended to allow for cavity exploration, further expression of  purulent material. Given surrounding soft tissue infection, continue with antibiotics as above No wound packing given relatively small size of cavity Continue with covering wound with gauze and paper tape to allow for continued drainage  Plan for follow-up in 4 days to reassess.  Would expect continued drainage of the site with healing by secondary intention and gradual improvement in surrounding cellulitis over the weekend until next office visit.  Cautioned that should anything worsen in regards to symptoms or infection, she should present to the emergency room for further evaluation.   ___________________________________________ Aisea Bouldin de 12/31/2020, MD, ABFM, CAQSM Primary Care and Sports Medicine [facility]

## 2021-01-19 ENCOUNTER — Ambulatory Visit (HOSPITAL_BASED_OUTPATIENT_CLINIC_OR_DEPARTMENT_OTHER): Payer: BC Managed Care – PPO | Admitting: Family Medicine

## 2021-01-19 ENCOUNTER — Other Ambulatory Visit: Payer: Self-pay

## 2021-01-19 ENCOUNTER — Encounter (HOSPITAL_BASED_OUTPATIENT_CLINIC_OR_DEPARTMENT_OTHER): Payer: Self-pay | Admitting: Family Medicine

## 2021-01-19 VITALS — BP 108/68 | HR 88 | Ht <= 58 in | Wt 152.6 lb

## 2021-01-19 DIAGNOSIS — L0291 Cutaneous abscess, unspecified: Secondary | ICD-10-CM

## 2021-01-19 NOTE — Assessment & Plan Note (Signed)
Has noticed some improvement in area since last visit.  Continues to have drainage from the wound.  Continues to take Bactrim, think she has a few days left.  Pain and redness surrounding the site has lessened since last visit, still with some discomfort/soreness in the area. Appearance of the wound suggests improvement in the surrounding soft tissue infection/cellulitis.  No purulent drainage present today, however still feels to have some induration lateral to wound opening Recommend referral to general surgery for further evaluation, possible further wound exploration for any additional loculations, extension of abscess Continue with remainder of Bactrim Continue with appropriate wound hygiene measures Plan for follow-up after evaluation with general surgery

## 2021-01-19 NOTE — Progress Notes (Signed)
    Procedures performed today:    None.  Independent interpretation of notes and tests performed by another provider:   None.  Brief History, Exam, Impression, and Recommendations:    BP 108/68   Pulse 88   Ht 4\' 10"  (1.473 m)   Wt 152 lb 9.6 oz (69.2 kg)   LMP 12/29/2020   SpO2 98%   BMI 31.89 kg/m   We still present, serosanguineous drainage present, granulation tissue noted at edges of wound, no purulence otherwise observed Surrounding redness/erythema has improved, some darker discoloration related to manipulation and recent procedure More so lateral to wound opening, still some firmness/induration  Abscess Has noticed some improvement in area since last visit.  Continues to have drainage from the wound.  Continues to take Bactrim, think she has a few days left.  Pain and redness surrounding the site has lessened since last visit, still with some discomfort/soreness in the area. Appearance of the wound suggests improvement in the surrounding soft tissue infection/cellulitis.  No purulent drainage present today, however still feels to have some induration lateral to wound opening Recommend referral to general surgery for further evaluation, possible further wound exploration for any additional loculations, extension of abscess Continue with remainder of Bactrim Continue with appropriate wound hygiene measures Plan for follow-up after evaluation with general surgery    ___________________________________________ Mischelle Reeg de 12/31/2020, MD, ABFM, CAQSM Primary Care and Sports Medicine [facility]

## 2021-01-20 ENCOUNTER — Encounter: Payer: Self-pay | Admitting: General Surgery

## 2021-01-20 ENCOUNTER — Ambulatory Visit (INDEPENDENT_AMBULATORY_CARE_PROVIDER_SITE_OTHER): Payer: BC Managed Care – PPO | Admitting: General Surgery

## 2021-01-20 DIAGNOSIS — L0291 Cutaneous abscess, unspecified: Secondary | ICD-10-CM

## 2021-01-20 NOTE — Patient Instructions (Signed)
Wash the area daily with warm soapy water, rinse well and pat dry. Cover with dry gauze or Band-Aid  until fully healed. You may place some Bacitracin ointment on the area if you would like. Finish all of your antibiotics.  Follow-up with our office as needed.  Please call and ask to speak with a nurse if you develop questions or concerns.

## 2021-01-20 NOTE — Progress Notes (Signed)
Patient ID: Alexandra Trujillo, female   DOB: [birth year], 30 y.o.   MRN: [id number]  No chief complaint on file.   HPI Alexandra Trujillo is a 30 y.o. female.   She has been referred by her primary care provider for further evaluation of an abscess on her posterior calf.  She says that she initially thought it was a spider bite, but it became more red and more painful.  She was evaluated at her PCPs office and an incision and drainage was performed.  She said that a lot of pus was expressed from the site, but no culture was taken and she was not required to pack the wound.  She was placed on a 10-day course of Bactrim.  She says that the surrounding erythema has improved significantly, but the area adjacent to the incision site is still somewhat indurated. She saw her PCP yesterday, and he was concerned that perhaps there was an area of undrained pus and referred her to general surgery for further evaluation.  She denies any fevers or chills.  The area is still tender but is not currently draining any purulent material.   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     Past Surgical History:  Procedure Laterality Date   IUD REMOVAL  2012   malposition   LAPAROSCOPY     to find IUD & remove it    Family History  Problem Relation Age of Onset   Thyroid disease Maternal Aunt    Stroke Maternal Grandmother    Hypertension Maternal Grandmother    Asthma Mother    Healthy Father     Social History Social History   Tobacco Use   Smoking status: Never   Smokeless tobacco: Never  Vaping Use   Vaping Use: Never used  Substance Use Topics   Alcohol use: Not Currently   Drug use: Never    Allergies  Allergen  Reactions   Tree Extract Anaphylaxis, Itching and Other (See Comments)    Plant based food, etc   Peanut-Containing Drug Products Other (See Comments)    Headache, sores in mouth    Current Outpatient Medications  Medication Sig Dispense Refill   albuterol (VENTOLIN HFA) 108 (90 Base) MCG/ACT inhaler Inhale 2 puffs into the lungs every 6 (six) hours as needed for wheezing or shortness of breath. 8 g 0   escitalopram (LEXAPRO) 20 MG tablet Take 1 tablet (20 mg total) by mouth daily. 90 tablet 1   ibuprofen (ADVIL,MOTRIN) 200 MG tablet Take 400 mg by mouth every 6 (six) hours as needed for moderate pain.     levocetirizine (XYZAL) 5 MG tablet Take 5 mg by mouth every evening.     lidocaine (XYLOCAINE) 2 % solution Use as directed 15 mLs in the mouth or throat as needed for mouth pain. Apply to canker sores in mouth using cotton tipped applicator as needed. 100 mL 0   montelukast (SINGULAIR) 10 MG tablet Take 1 tablet (10 mg total) by mouth at bedtime. 90 tablet 3   propranolol (INDERAL) 10 MG tablet Take 1 tablet (10 mg total) by mouth 2 (two) times daily. 180 tablet 1  rizatriptan (MAXALT-MLT) 5 MG disintegrating tablet Take 1 tablet (5 mg total) by mouth as needed for migraine. May repeat in 2 hours if needed. Needs appointment. 10 tablet 0   sulfamethoxazole-trimethoprim (BACTRIM DS) 800-160 MG tablet Take 1 tablet by mouth 2 (two) times daily for 10 days. 20 tablet 0   No current facility-administered medications for this visit.    Review of Systems Review of Systems  All other systems reviewed and are negative. Or as discussed in the history of present illness.  Blood pressure 137/79, pulse 85, temperature 97.8 F (36.6 C), height 4\' 11"  (1.499 m), weight 151 lb (68.5 kg), last menstrual period 12/29/2020, SpO2 98 %. Body mass index is 30.5 kg/m.  Physical Exam Physical Exam Constitutional:      General: She is not in acute distress.    Appearance: Normal appearance. She is  obese.  HENT:     Head: Normocephalic and atraumatic.     Nose:     Comments: Covered with a mask    Mouth/Throat:     Comments: Covered with a mask Eyes:     General: No scleral icterus.       Right eye: No discharge.        Left eye: No discharge.  Cardiovascular:     Rate and Rhythm: Normal rate and regular rhythm.  Pulmonary:     Effort: Pulmonary effort is normal. No respiratory distress.  Abdominal:     Palpations: Abdomen is soft.  Genitourinary:    Comments: Deferred Musculoskeletal:        General: Signs of injury present. No deformity.     Comments: Wound on right posterior calf.  Skin:         Comments: Small open wound on right posterior calf.  There is a small amount of surrounding induration and erythema without any fluctuance.  There is an ink mark encircling the site and the erythema has receded significantly from this area.  Neurological:     General: No focal deficit present.     Mental Status: She is alert and oriented to person, place, and time.  Psychiatric:        Mood and Affect: Mood normal.        Behavior: Behavior normal.    Data Reviewed I reviewed Dr. 12/31/2020 [place] clinic notes from yesterday and from June 9.  I also reviewed the electronic medical record but was unable to find any culture data related to the incision and drainage.  Assessment This is a 30 year old woman with a posterior calf abscess that has previously been incised and drained.  Her PCP was concerned that there may be additional undrained collections and referred her to general surgery.  On visual inspection and physical examination, I do not appreciate any areas of fluctuance, but I agreed to open the wound a little bit further and probe it to make sure no loculations or undrained cavities were present.  Even though she has been on Bactrim for 10 days, if any purulent material i was encountered, I plan to take a culture.  Plan After written consent was obtained.  The skin and  subcutaneous tissue surrounding the open wound were infiltrated with 1% lidocaine with epinephrine with added sodium bicarbonate.  The existing incision was extended slightly cranially and caudally.  No purulent material was encountered.  The wound was probed with forceps and no loculations identified.  The wound was dressed; I elected not to pack it due to its very small  size and limited cavity.  I recommended that the patient complete her course of antibiotics which she has done.  She should clean the area thoroughly with warm soapy water and may apply a thin layer of bacitracin while it heals by secondary intention.  I have not made a follow-up appointment, but she may return on as-needed basis.    Duanne Guess 01/20/2021, 2:15 PM

## 2021-02-10 ENCOUNTER — Encounter (HOSPITAL_BASED_OUTPATIENT_CLINIC_OR_DEPARTMENT_OTHER): Payer: Self-pay | Admitting: Obstetrics and Gynecology

## 2021-02-10 ENCOUNTER — Other Ambulatory Visit: Payer: Self-pay

## 2021-02-10 ENCOUNTER — Emergency Department (HOSPITAL_BASED_OUTPATIENT_CLINIC_OR_DEPARTMENT_OTHER)
Admission: EM | Admit: 2021-02-10 | Discharge: 2021-02-10 | Disposition: A | Discharge status: Home / Self Care | Payer: BC Managed Care – PPO | Attending: Emergency Medicine | Admitting: Emergency Medicine

## 2021-02-10 DIAGNOSIS — W540XXA Bitten by dog, initial encounter: Secondary | ICD-10-CM | POA: Diagnosis not present

## 2021-02-10 DIAGNOSIS — Z2914 Encounter for prophylactic rabies immune globin: Secondary | ICD-10-CM | POA: Diagnosis not present

## 2021-02-10 DIAGNOSIS — Z23 Encounter for immunization: Secondary | ICD-10-CM | POA: Insufficient documentation

## 2021-02-10 DIAGNOSIS — Z203 Contact with and (suspected) exposure to rabies: Secondary | ICD-10-CM | POA: Diagnosis not present

## 2021-02-10 DIAGNOSIS — Z8616 Personal history of COVID-19: Secondary | ICD-10-CM | POA: Insufficient documentation

## 2021-02-10 DIAGNOSIS — J45909 Unspecified asthma, uncomplicated: Secondary | ICD-10-CM | POA: Insufficient documentation

## 2021-02-10 DIAGNOSIS — S0185XA Open bite of other part of head, initial encounter: Secondary | ICD-10-CM

## 2021-02-10 DIAGNOSIS — Z91010 Allergy to peanuts: Secondary | ICD-10-CM | POA: Insufficient documentation

## 2021-02-10 DIAGNOSIS — S0125XA Open bite of nose, initial encounter: Secondary | ICD-10-CM | POA: Insufficient documentation

## 2021-02-10 MED ORDER — AMOXICILLIN-POT CLAVULANATE 875-125 MG PO TABS: 1.0000 | ORAL_TABLET | Freq: Two times a day (BID) | ORAL | 0 refills | Status: DC

## 2021-02-10 MED ORDER — TETANUS-DIPHTH-ACELL PERTUSSIS 5-2.5-18.5 LF-MCG/0.5 IM SUSY
0.5000 mL | PREFILLED_SYRINGE | Freq: Once | INTRAMUSCULAR | Status: AC
  Administered 2021-02-10: 0.5 mL via INTRAMUSCULAR
  Filled 2021-02-10: qty 0.5

## 2021-02-10 MED ORDER — IBUPROFEN 400 MG PO TABS
600.0000 mg | ORAL_TABLET | Freq: Once | ORAL | Status: AC
  Administered 2021-02-10: 600 mg via ORAL
  Filled 2021-02-10: qty 1

## 2021-02-10 MED ORDER — RABIES VACCINE, PCEC IM SUSR
1.0000 mL | Freq: Once | INTRAMUSCULAR | Status: AC
  Administered 2021-02-10: 1 mL via INTRAMUSCULAR
  Filled 2021-02-10: qty 1

## 2021-02-10 MED ORDER — BACITRACIN ZINC 500 UNIT/GM EX OINT
1.0000 "application " | TOPICAL_OINTMENT | Freq: Two times a day (BID) | CUTANEOUS | Status: DC
  Administered 2021-02-10: 1 via TOPICAL
  Filled 2021-02-10: qty 28.35

## 2021-02-10 MED ORDER — RABIES IMMUNE GLOBULIN 150 UNIT/ML IM INJ
20.0000 [IU]/kg | INJECTION | Freq: Once | INTRAMUSCULAR | Status: AC
  Administered 2021-02-10: 1425 [IU] via INTRAMUSCULAR
  Filled 2021-02-10: qty 10

## 2021-02-10 NOTE — ED Triage Notes (Signed)
Patient reports she was bitten by an unvaccinated dog today. Animal control has the dog in their possession.

## 2021-02-10 NOTE — ED Notes (Signed)
Pt states she has more of a headache than pain from the bites. Pt states the dog's head collided hard with the forehead of the Pt.

## 2021-02-10 NOTE — ED Provider Notes (Signed)
[facility] EMERGENCY DEPT Provider Note   CSN: [id number] Arrival date & time: 02/10/21  1648     History Chief Complaint  Patient presents with   Animal Bite    Alexandra Trujillo is a 30 y.o. female.  30 year old female with past medical history below including asthma, anxiety who presents with dog bite.  This afternoon, patient was bitten in the face by an unknown dog who was owned by a homeless person.  Dog was not up-to-date on vaccinations.  Animal control was able to capture the animal for quarantine and she has contact information for animal control.  She sustained lacerations to her forehead and nose.  No other areas of injury.  Unsure whether tetanus vaccination is up-to-date.  No medications prior to arrival.  No history of diabetes.  The history is provided by the patient.  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     Patient Active Problem List   Diagnosis Date Noted   Abscess 01/15/2021   Skin infection 01/12/2021   Sinusitis 12/16/2020   Rib pain 12/16/2020   BMI 32.0-32.9,adult 12/01/2020   Anxiety 12/01/2020   Environmental allergies 04/26/2018   Aphthous ulcer of mouth 04/26/2018   Perennial allergic rhinitis 04/26/2018   Menstrual migraine without status migrainosus, not intractable 04/26/2018   Rubella non-immune status, antepartum 02/11/2014    Past Surgical History:  Procedure Laterality Date   IUD REMOVAL  2012   malposition   LAPAROSCOPY     to find IUD & remove it     OB History     Gravida  4   Para  4   Term  4   Preterm      AB      Living  4      SAB      IAB      Ectopic      Multiple  0   Live Births  4            Family History  Problem Relation Age of Onset   Thyroid disease Maternal Aunt    Stroke Maternal Grandmother    Hypertension Maternal Grandmother    Asthma Mother    Healthy Father     Social History   Tobacco Use   Smoking status: Never   Smokeless tobacco: Never  Vaping Use   Vaping Use: Never used  Substance Use Topics   Alcohol use: Not Currently   Drug use: Never    Home Medications Prior to Admission medications   Medication Sig Start Date End Date Taking? Authorizing Provider  amoxicillin-clavulanate (AUGMENTIN) 875-125 MG tablet Take 1 tablet by mouth every 12 (twelve) hours. 02/10/21  Yes Gabryel Files, Ambrose Finland, MD  albuterol (VENTOLIN HFA) 108 (90 Base) MCG/ACT inhaler Inhale 2 puffs into the lungs every 6 (six) hours as needed for wheezing or shortness of breath. 06/18/20   Jannifer Rodney A, FNP  escitalopram (LEXAPRO) 20 MG tablet Take 1 tablet (20 mg total) by mouth daily. 12/01/20   de [place], Buren Kos, MD  ibuprofen (ADVIL,MOTRIN) 200 MG tablet Take 400 mg by mouth every 6 (six) hours  as needed for moderate pain.    [provider]  levocetirizine (XYZAL) 5 MG tablet Take 5 mg by mouth every evening.    [provider]  lidocaine (XYLOCAINE) 2 % solution Use as directed 15 mLs in the mouth or throat as needed for mouth pain. Apply to canker sores in mouth using cotton tipped applicator as needed. 11/04/20   Waldon Merl, PA-C  montelukast (SINGULAIR) 10 MG tablet Take 1 tablet (10 mg total) by mouth at bedtime. 02/08/20   Christen Butter, NP  propranolol (INDERAL) 10 MG tablet Take 1 tablet (10 mg total) by mouth 2 (two) times daily. 12/01/20   de [place], Buren Kos, MD  rizatriptan (MAXALT-MLT) 5 MG disintegrating tablet Take 1 tablet (5 mg total) by mouth as needed for migraine. May repeat in 2 hours if needed. Needs appointment. 10/22/20   Christen Butter, NP    Allergies    Tree extract and Peanut-containing drug products  Review of Systems    Review of Systems All other systems reviewed and are negative except that which was mentioned in HPI  Physical Exam Updated Vital Signs BP 137/82 (BP Location: Right Arm)   Pulse 61   Temp 98.1 F (36.7 C) (Oral)   Resp 15   Ht 4\' 11"  (1.499 m)   Wt 69.4 kg   SpO2 100%   BMI 30.90 kg/m   Physical Exam Vitals and nursing note reviewed.  Constitutional:      General: She is not in acute distress.    Appearance: She is well-developed.  HENT:     Head: Normocephalic.     Comments: Superficial linear abrasions on central forehead at hairline and across nasal bridge, no punctures    Mouth/Throat:     Mouth: Mucous membranes are moist.     Pharynx: Oropharynx is clear.  Eyes:     Conjunctiva/sclera: Conjunctivae normal.     Pupils: Pupils are equal, round, and reactive to light.  Musculoskeletal:     Cervical back: Neck supple.  Skin:    General: Skin is warm and dry.  Neurological:     Mental Status: She is alert and oriented to person, place, and time.  Psychiatric:        Judgment: Judgment normal.    ED Results / Procedures / Treatments   Labs (all labs ordered are listed, but only abnormal results are displayed) Labs Reviewed - No data to display  EKG None  Radiology No results found.  Procedures Procedures   Medications Ordered in ED Medications  rabies immune globulin (HYPERAB/KEDRAB) injection 1,425 Units (has no administration in time range)  rabies vaccine (RABAVERT) injection 1 mL (has no administration in time range)  Tdap (BOOSTRIX) injection 0.5 mL (has no administration in time range)  bacitracin ointment 1 application (has no administration in time range)    ED Course  I have reviewed the triage vital signs and the nursing notes.     MDM Rules/Calculators/A&P                          Wounds cleaned and bacitracin applied by ED nurse. No deep lacerations requiring repair. Recommended tdap and augmentin. I explained that because dog is in  quarantine with animal control, can hold off on rabies prophylaxis for now. Pt was insistent on receiving rabies prophylaxis; I explained risks of side effects/adverse effects but patient wanted it anyway. Instructed on receiving future doses of vaccine on days  3,7,14.  Discussed wound care and reviewed return precautions regarding signs of infection.  She voiced understanding. Final Clinical Impression(s) / ED Diagnoses Final diagnoses:  Dog bite of face, initial encounter    Rx / DC Orders ED Discharge Orders          Ordered    amoxicillin-clavulanate (AUGMENTIN) 875-125 MG tablet  Every 12 hours        02/10/21 1921             Tonae Livolsi, Ambrose Finland, MD 02/10/21 1929

## 2021-02-12 ENCOUNTER — Telehealth (HOSPITAL_BASED_OUTPATIENT_CLINIC_OR_DEPARTMENT_OTHER): Payer: Self-pay | Admitting: Family Medicine

## 2021-02-12 ENCOUNTER — Telehealth (HOSPITAL_BASED_OUTPATIENT_CLINIC_OR_DEPARTMENT_OTHER): Payer: Self-pay

## 2021-02-12 MED ORDER — RABIES VACCINE, PCEC IM SUSR: INTRAMUSCULAR | 0 refills | Status: DC

## 2021-02-12 NOTE — Telephone Encounter (Signed)
Pt called to see if PCP could administer 2nd Rabies dose. Was in ED for 1st and does not want to return there. I forwarded message to CMA who will try to get from Pharmacy and should return call if necessary.

## 2021-02-12 NOTE — Telephone Encounter (Signed)
Pt returned CMA call stating we cannot get shot . Pt states second does of Rabies shot is urgent and she can PU from a Pharmacy and have it administered in our office if need be. Please return call ASAP!

## 2021-02-12 NOTE — Telephone Encounter (Signed)
Spoke with patient regarding rabies vaccination and patient would like to have them administered at [facility]. Will send RX

## 2021-02-13 ENCOUNTER — Other Ambulatory Visit: Payer: Self-pay

## 2021-02-13 ENCOUNTER — Ambulatory Visit: Payer: BC Managed Care – PPO

## 2021-02-13 ENCOUNTER — Ambulatory Visit (INDEPENDENT_AMBULATORY_CARE_PROVIDER_SITE_OTHER): Payer: BC Managed Care – PPO | Admitting: Family Medicine

## 2021-02-13 DIAGNOSIS — Z0000 Encounter for general adult medical examination without abnormal findings: Secondary | ICD-10-CM | POA: Diagnosis not present

## 2021-02-13 DIAGNOSIS — W540XXA Bitten by dog, initial encounter: Secondary | ICD-10-CM

## 2021-02-13 DIAGNOSIS — S0185XA Open bite of other part of head, initial encounter: Secondary | ICD-10-CM | POA: Diagnosis not present

## 2021-02-13 DIAGNOSIS — Z23 Encounter for immunization: Secondary | ICD-10-CM | POA: Diagnosis not present

## 2021-02-13 NOTE — Progress Notes (Signed)
   Subjective:    Patient ID: Alexandra Trujillo, female    DOB: Apr 29, 1991, 30 y.o.   MRN: [id number]  HPI Patient was seen in ED on 07/05 for dog bite and received rabies vaccination. Per ED discharge patient needs to complete the series of 4 injections.   Review of Systems     Objective:   Physical Exam       Assessment & Plan:  Patient tolerated well. Injection in right deltoid. Patient will return for day 7 and day 14 injections.

## 2021-02-17 ENCOUNTER — Other Ambulatory Visit: Payer: Self-pay

## 2021-02-17 ENCOUNTER — Ambulatory Visit (INDEPENDENT_AMBULATORY_CARE_PROVIDER_SITE_OTHER): Payer: BC Managed Care – PPO | Admitting: Family Medicine

## 2021-02-17 DIAGNOSIS — Z23 Encounter for immunization: Secondary | ICD-10-CM

## 2021-02-17 DIAGNOSIS — S0185XA Open bite of other part of head, initial encounter: Secondary | ICD-10-CM | POA: Diagnosis not present

## 2021-02-17 DIAGNOSIS — W540XXA Bitten by dog, initial encounter: Secondary | ICD-10-CM

## 2021-02-17 NOTE — Progress Notes (Signed)
   Subjective:    Patient ID: Alexandra Trujillo, female    DOB: 1991-06-09, 30 y.o.   MRN: [id number]  HPI Dog bit sustained 07/05   Review of Systems     Objective:   Physical Exam        Assessment & Plan:  Injection number 3 of rabies series. Patient has 1 more injection to be given in 7 days

## 2021-02-19 ENCOUNTER — Encounter (HOSPITAL_BASED_OUTPATIENT_CLINIC_OR_DEPARTMENT_OTHER): Payer: Self-pay | Admitting: Family Medicine

## 2021-02-23 MED ORDER — NIRMATRELVIR/RITONAVIR (PAXLOVID)TABLET: 3.0000 | ORAL_TABLET | Freq: Two times a day (BID) | ORAL | 0 refills | Status: AC

## 2021-03-06 ENCOUNTER — Encounter (HOSPITAL_BASED_OUTPATIENT_CLINIC_OR_DEPARTMENT_OTHER): Payer: Self-pay | Admitting: Family Medicine

## 2021-03-06 ENCOUNTER — Other Ambulatory Visit: Payer: Self-pay

## 2021-03-06 ENCOUNTER — Ambulatory Visit (HOSPITAL_BASED_OUTPATIENT_CLINIC_OR_DEPARTMENT_OTHER): Payer: BC Managed Care – PPO | Admitting: Family Medicine

## 2021-03-06 DIAGNOSIS — J3089 Other allergic rhinitis: Secondary | ICD-10-CM

## 2021-03-06 DIAGNOSIS — T7840XA Allergy, unspecified, initial encounter: Secondary | ICD-10-CM | POA: Diagnosis not present

## 2021-03-06 MED ORDER — MONTELUKAST SODIUM 10 MG PO TABS: 10.0000 mg | ORAL_TABLET | Freq: Every day | ORAL | 3 refills | Status: DC

## 2021-03-06 NOTE — Assessment & Plan Note (Signed)
Requesting refill of Singulair, refill sent to pharmacy today

## 2021-03-06 NOTE — Progress Notes (Signed)
    Procedures performed today:    None.  Independent interpretation of notes and tests performed by another provider:   None.  Brief History, Exam, Impression, and Recommendations:     BP 102/72   Pulse 95   Ht 4\' 11"  (1.499 m)   Wt 154 lb (69.9 kg)   SpO2 98%   BMI 31.10 kg/m   Perennial allergic rhinitis Requesting refill of Singulair, refill sent to pharmacy today  Allergic reaction Last night, patient was eating teriyaki and began to develop puffy lips, coughing Patient reports that food also felt spicy and she had some throat irritation as a result Which began to notice the symptoms she took Benadryl, used her inhaler She denies experiencing any rash No feelings of palpitations, sweats, sense of impending doom at time of food ingestion last night Today, she feels that she has some mild sore throat, redness in her throat.  She also feels that she has had some indigestion today, some mild diarrhea On exam, patient is in no acute distress, lungs clear to auscultation bilaterally, cardiovascular exam with regular rate and rhythm Discussed with patient concerns related to food allergy and potential for systemic reactions, of primary concern is angioedema, loss of airway Given the patient appears stable today and it has been greater than 12 hours since initial symptoms, can continue with monitoring, use of Benadryl as needed Discussed signs and symptoms to be cautious of and to have low threshold for presentation to the emergency room including angioedema, facial swelling, change in voice Can utilize conservative measures to help with throat irritation  Plan for follow-up in about 3 to 4 months to complete CPE   ___________________________________________ Kaemon Barnett de , MD, ABFM, CAQSM Primary Care and Sports Medicine [facility]

## 2021-03-06 NOTE — Assessment & Plan Note (Signed)
Last night, patient was eating teriyaki and began to develop puffy lips, coughing Patient reports that food also felt spicy and she had some throat irritation as a result Which began to notice the symptoms she took Benadryl, used her inhaler She denies experiencing any rash No feelings of palpitations, sweats, sense of impending doom at time of food ingestion last night Today, she feels that she has some mild sore throat, redness in her throat.  She also feels that she has had some indigestion today, some mild diarrhea On exam, patient is in no acute distress, lungs clear to auscultation bilaterally, cardiovascular exam with regular rate and rhythm Discussed with patient concerns related to food allergy and potential for systemic reactions, of primary concern is angioedema, loss of airway Given the patient appears stable today and it has been greater than 12 hours since initial symptoms, can continue with monitoring, use of Benadryl as needed Discussed signs and symptoms to be cautious of and to have low threshold for presentation to the emergency room including angioedema, facial swelling, change in voice Can utilize conservative measures to help with throat irritation

## 2021-03-06 NOTE — Patient Instructions (Signed)
  Medication Instructions:  Your physician recommends that you continue on your current medications as directed. Please refer to the Current Medication list given to you today. --If you need a refill on any your medications before your next appointment, please call your pharmacy first. If no refills are authorized on file call the office.--  Follow-Up: Your next appointment:   Your physician recommends that you schedule a follow-up appointment in: 3-4 MONTHS for CPE with Dr. de [place]  Thanks for letting us be apart of your health journey!!  Primary Care and Sports Medicine   Dr. Ceasar Mons [place]   We encourage you to activate your patient portal called "MyChart".  Sign up information is provided on this After Visit Summary.  MyChart is used to connect with patients for Virtual Visits (Telemedicine).  Patients are able to view lab/test results, encounter notes, upcoming appointments, etc.  Non-urgent messages can be sent to your provider as well. To learn more about what you can do with MyChart, please visit --  [email or web address].

## 2021-03-17 ENCOUNTER — Ambulatory Visit: Payer: BC Managed Care – PPO | Admitting: Neurology

## 2021-03-17 ENCOUNTER — Encounter: Payer: Self-pay | Admitting: Neurology

## 2021-03-17 ENCOUNTER — Telehealth: Payer: Self-pay | Admitting: *Deleted

## 2021-03-17 NOTE — Telephone Encounter (Signed)
Patient LWBS today d/t arriving late (per appt note from check-in). Patient was r/s. This is her first missed appt w/ [facility].

## 2021-04-28 ENCOUNTER — Encounter: Payer: Self-pay | Admitting: Neurology

## 2021-04-28 ENCOUNTER — Ambulatory Visit: Payer: BC Managed Care – PPO | Admitting: Neurology

## 2021-04-28 ENCOUNTER — Encounter: Payer: Self-pay | Admitting: General Surgery

## 2021-04-29 ENCOUNTER — Encounter: Payer: Self-pay | Admitting: Neurology

## 2021-05-02 ENCOUNTER — Encounter: Payer: Self-pay | Admitting: Emergency Medicine

## 2021-05-02 ENCOUNTER — Other Ambulatory Visit: Payer: Self-pay

## 2021-05-02 ENCOUNTER — Ambulatory Visit
Admission: EM | Admit: 2021-05-02 | Discharge: 2021-05-02 | Disposition: A | Discharge status: Home / Self Care | Payer: BC Managed Care – PPO | Attending: Family Medicine | Admitting: Family Medicine

## 2021-05-02 DIAGNOSIS — L0291 Cutaneous abscess, unspecified: Secondary | ICD-10-CM

## 2021-05-02 DIAGNOSIS — N764 Abscess of vulva: Secondary | ICD-10-CM

## 2021-05-02 MED ORDER — DOXYCYCLINE HYCLATE 100 MG PO CAPS: 100.0000 mg | ORAL_CAPSULE | Freq: Two times a day (BID) | ORAL | 0 refills | Status: DC

## 2021-05-02 NOTE — ED Triage Notes (Signed)
Pt has a cyst near her buttock area that has gotten worse the past 2 days.

## 2021-05-02 NOTE — Discharge Instructions (Addendum)
Take all medication.  The abscess will continue to drain. Return if abscess remains after completion of antibiotic or if area becomes soft.

## 2021-05-02 NOTE — ED Provider Notes (Signed)
Renaldo Fiddler    CSN: [id number] Arrival date & time: 05/02/21  1530      History   Chief Complaint Chief Complaint  Patient presents with   Cyst    HPI Alexandra Trujillo is a 30 y.o. female.   HPI Patient presents today with painful abscess involving the right labia region present x 2 days. Endorses the abscess has drained clear and bloody drainage. No history of recurrent abscess. Denies fever, nausea, or vomiting.  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     Patient Active Problem List   Diagnosis Date Noted   Allergic reaction 03/06/2021   Abscess 01/15/2021   Skin infection 01/12/2021   Sinusitis 12/16/2020   Rib pain 12/16/2020   BMI 32.0-32.9,adult 12/01/2020   Anxiety 12/01/2020   Environmental allergies 04/26/2018   Aphthous ulcer of mouth 04/26/2018   Perennial allergic rhinitis 04/26/2018   Menstrual migraine without status migrainosus, not intractable 04/26/2018   Rubella non-immune status, antepartum 02/11/2014    Past Surgical History:  Procedure Laterality Date   IUD REMOVAL  2012   malposition   LAPAROSCOPY     to find IUD & remove it    OB History     Gravida  4   Para  4   Term  4   Preterm      AB      Living  4      SAB      IAB      Ectopic      Multiple  0   Live Births  4            Home Medications    Prior to Admission medications   Medication Sig Start Date End Date Taking? Authorizing Provider  albuterol (VENTOLIN HFA) 108 (90 Base) MCG/ACT inhaler Inhale 2 puffs into the lungs every 6 (six) hours as needed for wheezing or shortness of breath. 06/18/20  Yes Hawks, Christy A, FNP  doxycycline (VIBRAMYCIN) 100 MG  capsule Take 1 capsule (100 mg total) by mouth 2 (two) times daily. 05/02/21  Yes Bing Neighbors, FNP  escitalopram (LEXAPRO) 20 MG tablet Take 1 tablet (20 mg total) by mouth daily. 12/01/20  Yes de [place], Raymond J, MD  ibuprofen (ADVIL,MOTRIN) 200 MG tablet Take 400 mg by mouth every 6 (six) hours as needed for moderate pain.   Yes [provider]  levocetirizine (XYZAL) 5 MG tablet Take 5 mg by mouth every evening.   Yes [provider]  lidocaine (XYLOCAINE) 2 % solution Use as directed 15 mLs in the mouth or throat as needed for mouth pain. Apply to canker sores in mouth using cotton tipped applicator as needed. 11/04/20  Yes Waldon Merl, PA-C  montelukast (SINGULAIR) 10 MG tablet Take 1 tablet (10 mg total) by mouth at bedtime. 03/06/21  Yes de [place], Raymond J, MD  propranolol (INDERAL) 10 MG tablet Take 1 tablet (10 mg total) by mouth 2 (two) times daily. 12/01/20  Yes de [place], Raymond J, MD  rizatriptan (MAXALT-MLT) 5 MG disintegrating tablet Take 1 tablet (5 mg total) by mouth as needed for migraine. May repeat  in 2 hours if needed. Needs appointment. 10/22/20  Yes Christen Butter, NP    Family History Family History  Problem Relation Age of Onset   Thyroid disease Maternal Aunt    Stroke Maternal Grandmother    Hypertension Maternal Grandmother    Asthma Mother    Healthy Father     Social History Social History   Tobacco Use   Smoking status: Never   Smokeless tobacco: Never  Vaping Use   Vaping Use: Never used  Substance Use Topics   Alcohol use: Not Currently   Drug use: Never     Allergies   Tree extract, Augmentin [amoxicillin-pot clavulanate], and Peanut-containing drug products   Review of Systems Review of Systems Pertinent negatives listed in HPI  Physical Exam Triage Vital Signs ED Triage Vitals  Enc Vitals Group     BP 05/02/21 1538 132/85     Pulse Rate 05/02/21 1538 (!) 104     Resp 05/02/21 1538 18     Temp 05/02/21 1538 98 F  (36.7 C)     Temp Source 05/02/21 1538 Oral     SpO2 05/02/21 1538 97 %     Weight --      Height --      Head Circumference --      Peak Flow --      Pain Score 05/02/21 1542 6     Pain Loc --      Pain Edu? --      Excl. in GC? --    No data found.  Updated Vital Signs BP 132/85 (BP Location: Left Arm)   Pulse (!) 104   Temp 98 F (36.7 C) (Oral)   Resp 18   LMP  (LMP Unknown)   SpO2 97%   Visual Acuity Right Eye Distance:   Left Eye Distance:   Bilateral Distance:    Right Eye Near:   Left Eye Near:    Bilateral Near:     Physical Exam General appearance: Alert, well developed, well nourished, cooperative and in no distress Head: Normocephalic, without obvious abnormality, atraumatic Respiratory: Respirations even and unlabored, normal respiratory rate Heart: Rate and rhythm normal. No gallop or murmurs noted on exam  Extremities: No gross deformities Skin: Skin color, texture, turgor normal. No rashes seen  Psych: Appropriate mood and affect. Neurologic: GCS 15, normal coordination, normal gait  Chaperones present  during procedure  UC Treatments / Results  Labs (all labs ordered are listed, but only abnormal results are displayed) Labs Reviewed - No data to display  EKG   Radiology No results found.  Procedures Incision and Drainage  Date/Time: 05/02/2021 3:40 PM Performed by: Bing Neighbors, FNP Authorized by: Bing Neighbors, FNP   Consent:    Consent obtained:  Verbal   Consent given by:  Patient   Risks discussed:  Bleeding, incomplete drainage and pain   Alternatives discussed:  No treatment Universal protocol:    Procedure explained and questions answered to patient or proxy's satisfaction: no     Relevant documents present and verified: no     Test results available : no     Imaging studies available: no     Required blood products, implants, devices, and special equipment available: no     Site/side marked: no     Immediately  prior to procedure, a time out was called: no     Patient identity confirmed:  Verbally with patient Location:    Type:  Abscess  Location:  Anogenital   Anogenital location:  Vulva Pre-procedure details:    Skin preparation:  Antiseptic wash Sedation:    Sedation type:  None Anesthesia:    Anesthesia method:  Local infiltration   Local anesthetic:  Lidocaine 2% WITH epi Procedure type:    Complexity:  Complex Procedure details:    Incision depth:  Submucosal   Drainage:  Bloody and serous   Drainage amount:  Scant   Wound treatment:  Wound left open Post-procedure details:    Procedure completion:  Tolerated well, no immediate complications Comments:     I&D attempted, unsuccessful in drainage.  (including critical care time)  Medications Ordered in UC Medications - No data to display  Initial Impression / Assessment and Plan / UC Course  I have reviewed the triage vital signs and the nursing notes.  Pertinent labs & imaging results that were available during my care of the patient were reviewed by me and considered in my medical decision making (see chart for details).    Abscess of Genitalia  I&D unsuccessful. Trial Doxycyline BID X 10 days. Return precautions given if abscess worsens or doesn't fully drain. Final Clinical Impressions(s) / UC Diagnoses   Final diagnoses:  Abscess of genital labia     Discharge Instructions      Take all medication.  The abscess will continue to drain. Return if abscess remains after completion of antibiotic or if area becomes soft.    ED Prescriptions     Medication Sig Dispense Auth. Provider   doxycycline (VIBRAMYCIN) 100 MG capsule Take 1 capsule (100 mg total) by mouth 2 (two) times daily. 20 capsule Bing Neighbors, FNP      PDMP not reviewed this encounter.   Bing Neighbors, [place] 05/04/21 2109

## 2021-06-15 ENCOUNTER — Other Ambulatory Visit (HOSPITAL_BASED_OUTPATIENT_CLINIC_OR_DEPARTMENT_OTHER): Payer: Self-pay | Admitting: Family Medicine

## 2021-06-15 DIAGNOSIS — F419 Anxiety disorder, unspecified: Secondary | ICD-10-CM

## 2021-07-22 ENCOUNTER — Encounter (HOSPITAL_BASED_OUTPATIENT_CLINIC_OR_DEPARTMENT_OTHER): Payer: Self-pay | Admitting: Family Medicine

## 2021-07-22 DIAGNOSIS — G43829 Menstrual migraine, not intractable, without status migrainosus: Secondary | ICD-10-CM

## 2021-07-22 MED ORDER — RIZATRIPTAN BENZOATE 5 MG PO TBDP: 5.0000 mg | ORAL_TABLET | ORAL | 0 refills | Status: DC | PRN

## 2021-07-27 ENCOUNTER — Ambulatory Visit: Payer: BC Managed Care – PPO | Admitting: Obstetrics & Gynecology

## 2021-07-27 ENCOUNTER — Other Ambulatory Visit: Payer: Self-pay

## 2021-07-27 ENCOUNTER — Encounter: Payer: Self-pay | Admitting: Obstetrics & Gynecology

## 2021-07-27 VITALS — BP 115/74 | HR 63 | Ht 59.0 in | Wt 141.0 lb

## 2021-07-27 DIAGNOSIS — N920 Excessive and frequent menstruation with regular cycle: Secondary | ICD-10-CM | POA: Diagnosis not present

## 2021-07-27 DIAGNOSIS — G43829 Menstrual migraine, not intractable, without status migrainosus: Secondary | ICD-10-CM

## 2021-07-27 NOTE — Progress Notes (Signed)
° °  Subjective:    Patient ID: Alexandra Trujillo, female    DOB: 1991-03-23, 30 y.o.   MRN: [id number]  HPI Erroneous note.  Unable to delete   Review of Systems     Objective:   Physical Exam        Assessment & Plan:

## 2021-07-27 NOTE — Progress Notes (Signed)
Pt here to discuss heavy periods Will schedule annual

## 2021-07-27 NOTE — Progress Notes (Signed)
° °  Subjective:    Patient ID: Alexandra Trujillo, female    DOB: 16-Oct-1990, 30 y.o.   MRN: [id number]  HPI  30 yo female presents with heavy menstrual cycle.  Pt has clots the first two days.  Then spotting.  Pt showed me picture on phone of large clot.  + uterine cramping.  +panful intercourse around menstrual cycle;  Pt's Sig other has vasectomy.  Pt has extensive history of trying multiple types of birth control and not working including OCPs which made migraines worse. Depo causes hair loss.  Had uterine perforation with IUD.  Failed IUD attempt with [place] recently.  Does not want nuva ring.  Patient is interested in an ultrasound to see if there is an anatomical problem that is causing heavy bleeding on days 1 and 2.  Review of Systems  Constitutional: Negative.   Respiratory: Negative.    Cardiovascular: Negative.   Gastrointestinal: Negative.   Genitourinary:  Positive for dyspareunia, menstrual problem and pelvic pain.      Objective:   Physical Exam Vitals reviewed.  Constitutional:      General: She is not in acute distress.    Appearance: She is well-developed.  HENT:     Head: Normocephalic and atraumatic.  Eyes:     Conjunctiva/sclera: Conjunctivae normal.  Cardiovascular:     Rate and Rhythm: Normal rate.  Pulmonary:     Effort: Pulmonary effort is normal.  Genitourinary:    Comments: Tanner V Vulva:  No lesion Vagina:  Pink, no lesions, no discharge, no blood Cervix:  No CMT Uterus:  Non tender, mobile Right adnexa--non tender, no mass Left adnexa--non tender, no mass  Skin:    General: Skin is warm and dry.  Neurological:     Mental Status: She is alert and oriented to person, place, and time.  Psychiatric:        Mood and Affect: Mood normal.   Vitals:   07/27/21 1305  BP: 115/74  Pulse: 63  Weight: 141 lb (64 kg)  Height: 4\' 11"  (1.499 m)      Assessment & Plan:  30 year old female who presents for menorrhagia with regular cycle.  Her period only  last 4 days but the first 2 days are very heavy with clots. Pelvic ultrasound complete with transvaginal Will treat based on results.  If no anatomical result, I would suggest trying Tranxene Amick acid. Patient encouraged to have primary care physician help with migraines.   She is hoping he can manage without a specialty referral.   30 minutes spent during encounter including review of records, history and physical, counseling, coordination of care, and documentation.

## 2021-07-28 ENCOUNTER — Ambulatory Visit (INDEPENDENT_AMBULATORY_CARE_PROVIDER_SITE_OTHER): Payer: BC Managed Care – PPO

## 2021-07-28 ENCOUNTER — Ambulatory Visit
Admission: EM | Admit: 2021-07-28 | Discharge: 2021-07-28 | Disposition: A | Discharge status: Home / Self Care | Payer: BC Managed Care – PPO | Attending: Family Medicine | Admitting: Family Medicine

## 2021-07-28 ENCOUNTER — Encounter: Payer: Self-pay | Admitting: Emergency Medicine

## 2021-07-28 DIAGNOSIS — N920 Excessive and frequent menstruation with regular cycle: Secondary | ICD-10-CM

## 2021-07-28 DIAGNOSIS — H65191 Other acute nonsuppurative otitis media, right ear: Secondary | ICD-10-CM

## 2021-07-28 DIAGNOSIS — J209 Acute bronchitis, unspecified: Secondary | ICD-10-CM

## 2021-07-28 DIAGNOSIS — N888 Other specified noninflammatory disorders of cervix uteri: Secondary | ICD-10-CM | POA: Diagnosis not present

## 2021-07-28 MED ORDER — PROMETHAZINE-DM 6.25-15 MG/5ML PO SYRP: 5.0000 mL | ORAL_SOLUTION | Freq: Four times a day (QID) | ORAL | 0 refills | Status: DC | PRN

## 2021-07-28 MED ORDER — PREDNISONE 20 MG PO TABS: 40.0000 mg | ORAL_TABLET | Freq: Every day | ORAL | 0 refills | Status: DC

## 2021-07-28 MED ORDER — ALBUTEROL SULFATE HFA 108 (90 BASE) MCG/ACT IN AERS: 2.0000 | INHALATION_SPRAY | Freq: Four times a day (QID) | RESPIRATORY_TRACT | 0 refills | Status: DC | PRN

## 2021-07-28 NOTE — ED Triage Notes (Signed)
Pt c/o cough, chest congestion, pain in the chest when she cough x 1 week.

## 2021-07-28 NOTE — ED Provider Notes (Signed)
Renaldo Fiddler    CSN: [id number] Arrival date & time: 07/28/21  1120      History   Chief Complaint Chief Complaint  Patient presents with   Cough    HPI Alexandra Trujillo is a 30 y.o. female.   HPI Patient here today with one week of cough, congestion, and chest discomfort with taking deep breaths and coughing.  No fever. No known exposures to COVID or Flu. She has tried relief with OTC medication without relief. Has history of bronchitis with URI symptoms. Endorses wheezing and shortness of breath.   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     Patient Active Problem List   Diagnosis Date Noted   Allergic reaction 03/06/2021   Abscess 01/15/2021   Skin infection 01/12/2021   Sinusitis 12/16/2020   Rib pain 12/16/2020   BMI 32.0-32.9,adult 12/01/2020   Anxiety 12/01/2020   Environmental allergies 04/26/2018   Aphthous ulcer of mouth 04/26/2018   Perennial allergic rhinitis 04/26/2018   Menstrual migraine without status migrainosus, not intractable 04/26/2018   Rubella non-immune status, antepartum 02/11/2014    Past Surgical History:  Procedure Laterality Date   IUD REMOVAL  2012   malposition   LAPAROSCOPY     to find IUD & remove it    OB History     Gravida  4   Para  4   Term  4   Preterm      AB      Living  4      SAB      IAB      Ectopic      Multiple  0   Live Births  4            Home Medications    Prior to Admission medications   Medication Sig Start Date End Date Taking? Authorizing Provider  albuterol (VENTOLIN HFA) 108 (90 Base) MCG/ACT inhaler Inhale 2 puffs into the lungs every 6 (six) hours as needed for wheezing or  shortness of breath. 06/18/20   Junie Spencer, FNP  doxycycline (VIBRAMYCIN) 100 MG capsule Take 1 capsule (100 mg total) by mouth 2 (two) times daily. 05/02/21   Bing Neighbors, FNP  escitalopram (LEXAPRO) 20 MG tablet TAKE 1 TABLET(20 MG) BY MOUTH DAILY 06/15/21   de [place], Buren Kos, MD  ibuprofen (ADVIL,MOTRIN) 200 MG tablet Take 400 mg by mouth every 6 (six) hours as needed for moderate pain.    [provider]  levocetirizine (XYZAL) 5 MG tablet Take 5 mg by mouth every evening.    [provider]  lidocaine (XYLOCAINE) 2 % solution Use as directed 15 mLs in the mouth or throat as needed for mouth pain. Apply to canker sores in mouth using cotton tipped applicator as needed. 11/04/20   Waldon Merl, PA-C  montelukast (SINGULAIR) 10 MG tablet Take 1 tablet (10 mg total) by mouth at bedtime. 03/06/21   de [place], Buren Kos, MD  propranolol (INDERAL) 10 MG tablet TAKE 1 TABLET(10 MG) BY MOUTH TWICE DAILY 06/15/21   de [place], Buren Kos, MD  rizatriptan (MAXALT-MLT) 5 MG disintegrating tablet Take 1 tablet (  5 mg total) by mouth as needed for migraine. May repeat in 2 hours if needed. Needs appointment. 07/22/21   de [place], Buren Kos, MD    Family History Family History  Problem Relation Age of Onset   Thyroid disease Maternal Aunt    Stroke Maternal Grandmother    Hypertension Maternal Grandmother    Asthma Mother    Healthy Father     Social History Social History   Tobacco Use   Smoking status: Never   Smokeless tobacco: Never  Vaping Use   Vaping Use: Never used  Substance Use Topics   Alcohol use: Not Currently   Drug use: Never     Allergies   Tree extract, Augmentin [amoxicillin-pot clavulanate], and Peanut-containing drug products   Review of Systems Review of Systems Pertinent negatives listed in HPI   Physical Exam Triage Vital Signs ED Triage Vitals  Enc Vitals Group     BP 07/28/21 1236 129/90     Pulse Rate 07/28/21 1236 83     Resp  07/28/21 1236 18     Temp 07/28/21 1236 98 F (36.7 C)     Temp Source 07/28/21 1236 Oral     SpO2 07/28/21 1236 98 %     Weight --      Height --      Head Circumference --      Peak Flow --      Pain Score 07/28/21 1235 0     Pain Loc --      Pain Edu? --      Excl. in GC? --    No data found.  Updated Vital Signs BP 129/90 (BP Location: Left Arm)    Pulse 83    Temp 98 F (36.7 C) (Oral)    Resp 18    LMP 07/24/2021    SpO2 98%   Visual Acuity Right Eye Distance:   Left Eye Distance:   Bilateral Distance:    Right Eye Near:   Left Eye Near:    Bilateral Near:     Physical Exam General appearance: alert, Ill-appearing, no distress Head: Normocephalic, without obvious abnormality, atraumatic ENT: TM with effusions present bilaterally, nares with congestion and mucosal swelling, oropharynx w/o erythema or exudate  Respiratory: Respirations even , unlabored, coarse lung sound, expiratory wheeze Heart: Rate and rhythm normal.  Extremities: No gross deformities Skin: Skin color, texture, turgor normal. No rashes seen  Psych: Appropriate mood and affect. Neurologic: No obvious focal neurological abnormalities  UC Treatments / Results  Labs (all labs ordered are listed, but only abnormal results are displayed) Labs Reviewed - No data to display  EKG  Procedures Procedures (including critical care time)  Medications Ordered in UC Medications - No data to display  Initial Impression / Assessment and Plan / UC Course  I have reviewed the triage vital signs and the nursing notes.  Pertinent labs & imaging results that were available during my care of the patient were reviewed by me and considered in my medical decision making (see chart for details).    Acute bronchitis and MEE Treatment with prednisone, albuterol, promethazine DM. Strict return precautions if symptoms do not improve. ER if symptoms become severe. Final Clinical Impressions(s) / UC Diagnoses    Final diagnoses:  Acute bronchitis, unspecified organism  Acute MEE (middle ear effusion), right   Discharge Instructions   None    ED Prescriptions     Medication Sig Dispense Auth. Provider   albuterol (VENTOLIN HFA)  108 (90 Base) MCG/ACT inhaler  (Status: Discontinued) Inhale 2 puffs into the lungs every 6 (six) hours as needed for wheezing or shortness of breath. 8 g Bing Neighbors, FNP   predniSONE (DELTASONE) 20 MG tablet  (Status: Discontinued) Take 2 tablets (40 mg total) by mouth daily with breakfast. 10 tablet Bing Neighbors, FNP   promethazine-dextromethorphan (PROMETHAZINE-DM) 6.25-15 MG/5ML syrup  (Status: Discontinued) Take 5 mLs by mouth 4 (four) times daily as needed for cough. 180 mL Bing Neighbors, FNP   promethazine-dextromethorphan (PROMETHAZINE-DM) 6.25-15 MG/5ML syrup Take 5 mLs by mouth 4 (four) times daily as needed for cough. 180 mL Bing Neighbors, FNP   albuterol (VENTOLIN HFA) 108 (90 Base) MCG/ACT inhaler Inhale 2 puffs into the lungs every 6 (six) hours as needed for wheezing or shortness of breath. 8 g Bing Neighbors, FNP   predniSONE (DELTASONE) 20 MG tablet Take 2 tablets (40 mg total) by mouth daily with breakfast. 10 tablet Bing Neighbors, FNP      PDMP not reviewed this encounter.   Bing Neighbors, FNP 07/30/21 [phone]

## 2021-08-05 ENCOUNTER — Encounter: Payer: Self-pay | Admitting: Obstetrics & Gynecology

## 2021-08-11 NOTE — Progress Notes (Signed)
Last pap- 08/30/17- negative

## 2021-08-13 ENCOUNTER — Ambulatory Visit
Admission: EM | Admit: 2021-08-13 | Discharge: 2021-08-13 | Disposition: A | Discharge status: Home / Self Care | Payer: BC Managed Care – PPO | Attending: Physician Assistant | Admitting: Physician Assistant

## 2021-08-13 ENCOUNTER — Other Ambulatory Visit (HOSPITAL_COMMUNITY)
Admission: RE | Admit: 2021-08-13 | Discharge: 2021-08-13 | Disposition: A | Discharge status: Home / Self Care | Payer: BC Managed Care – PPO | Source: Ambulatory Visit | Attending: Obstetrics & Gynecology | Admitting: Obstetrics & Gynecology

## 2021-08-13 ENCOUNTER — Encounter: Payer: BC Managed Care – PPO | Admitting: Obstetrics & Gynecology

## 2021-08-13 ENCOUNTER — Other Ambulatory Visit: Payer: Self-pay

## 2021-08-13 ENCOUNTER — Ambulatory Visit (INDEPENDENT_AMBULATORY_CARE_PROVIDER_SITE_OTHER): Payer: BC Managed Care – PPO | Admitting: Obstetrics & Gynecology

## 2021-08-13 ENCOUNTER — Encounter: Payer: Self-pay | Admitting: Obstetrics & Gynecology

## 2021-08-13 VITALS — BP 136/83 | HR 81 | Ht 59.0 in | Wt 151.0 lb

## 2021-08-13 DIAGNOSIS — R42 Dizziness and giddiness: Secondary | ICD-10-CM

## 2021-08-13 DIAGNOSIS — N888 Other specified noninflammatory disorders of cervix uteri: Secondary | ICD-10-CM

## 2021-08-13 DIAGNOSIS — H9203 Otalgia, bilateral: Secondary | ICD-10-CM

## 2021-08-13 DIAGNOSIS — Z01419 Encounter for gynecological examination (general) (routine) without abnormal findings: Secondary | ICD-10-CM

## 2021-08-13 DIAGNOSIS — N920 Excessive and frequent menstruation with regular cycle: Secondary | ICD-10-CM | POA: Diagnosis not present

## 2021-08-13 MED ORDER — MECLIZINE HCL 25 MG PO TABS: 25.0000 mg | ORAL_TABLET | Freq: Three times a day (TID) | ORAL | 0 refills | Status: DC | PRN

## 2021-08-13 MED ORDER — TRANEXAMIC ACID 650 MG PO TABS: 1300.0000 mg | ORAL_TABLET | Freq: Three times a day (TID) | ORAL | 2 refills | Status: DC

## 2021-08-13 NOTE — Addendum Note (Signed)
Addended by: Lesly Dukes on: 08/13/2021 04:42 PM   Modules accepted: Orders

## 2021-08-13 NOTE — ED Provider Notes (Signed)
[facility]    CSN: [id number] Arrival date & time: 08/13/21  1432      History   Chief Complaint Chief Complaint  Patient presents with   Otalgia   Dizziness    HPI Alexandra Trujillo is a 31 y.o. female.   Patient presents today with a several week history of vertigo.  Reports that she has severe allergies that are barely managed with current regimen of antihistamine, montelukast, albuterol.  She was sick several weeks ago and was seen for serous otitis media and bronchitis on 07/28/2021 and treated with steroids.  Reports majority of symptoms resolved but she continues to have bilateral ear fullness with associated discomfort.  She also has vertigo which is described as room spinning sensation worse with changing position of her head.  Denies any chest pain, fever, lightheadedness, near syncope, syncopal episodes.  She denies any recent medication changes.  She has not seen an ENT or had surgery involving her ears in the past.  She did have her OB/GYN look at her ears at which point they saw a cyst in her right ear with associated bleeding prompting her evaluation today.   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     Patient Active Problem List   Diagnosis Date Noted   Allergic reaction 03/06/2021   Abscess 01/15/2021   Skin infection 01/12/2021   Sinusitis 12/16/2020   Rib pain 12/16/2020   BMI 32.0-32.9,adult 12/01/2020   Anxiety 12/01/2020   Environmental allergies 04/26/2018   Aphthous ulcer of mouth 04/26/2018   Perennial allergic rhinitis 04/26/2018   Menstrual migraine without status migrainosus, not intractable 04/26/2018   Rubella non-immune status, antepartum 02/11/2014     Past Surgical History:  Procedure Laterality Date   IUD REMOVAL  2012   malposition   LAPAROSCOPY     to find IUD & remove it    OB History     Gravida  4   Para  4   Term  4   Preterm      AB      Living  4      SAB      IAB      Ectopic      Multiple  0   Live Births  4            Home Medications    Prior to Admission medications   Medication Sig Start Date End Date Taking? Authorizing Provider  meclizine (ANTIVERT) 25 MG tablet Take 1 tablet (25 mg total) by mouth 3 (three) times daily as needed for dizziness. 08/13/21  Yes Josedaniel Haye, Meckenzie K, PA-C  albuterol (VENTOLIN HFA) 108 (90 Base) MCG/ACT inhaler Inhale 2 puffs into the lungs every 6 (six) hours as needed for wheezing or shortness of breath. 07/28/21   Bing Neighbors, FNP  escitalopram (LEXAPRO) 20 MG tablet TAKE 1 TABLET(20 MG) BY MOUTH DAILY 06/15/21   de [place], Buren Kos, MD  ibuprofen (ADVIL,MOTRIN) 200 MG tablet Take 400 mg by mouth every 6 (six) hours as needed for moderate pain.    [provider]  levocetirizine (XYZAL) 5 MG tablet Take 5 mg by mouth  every evening.    [provider]  lidocaine (XYLOCAINE) 2 % solution Use as directed 15 mLs in the mouth or throat as needed for mouth pain. Apply to canker sores in mouth using cotton tipped applicator as needed. 11/04/20   Waldon MerlMartin, William C, PA-C  montelukast (SINGULAIR) 10 MG tablet Take 1 tablet (10 mg total) by mouth at bedtime. 03/06/21   de [place]uba, Raymond J, MD  promethazine-dextromethorphan (PROMETHAZINE-DM) 6.25-15 MG/5ML syrup Take 5 mLs by mouth 4 (four) times daily as needed for cough. 07/28/21   Bing NeighborsHarris, Kimberly S, FNP  propranolol (INDERAL) 10 MG tablet TAKE 1 TABLET(10 MG) BY MOUTH TWICE DAILY 06/15/21   de [place], Buren Kosaymond J, MD  rizatriptan (MAXALT-MLT) 5 MG disintegrating tablet Take 1 tablet (5 mg total) by mouth as needed for migraine. May repeat in 2 hours if needed. Needs appointment. 07/22/21   de [place], Buren Kosaymond  J, MD  tranexamic acid (LYSTEDA) 650 MG TABS tablet Take 2 tablets (1,300 mg total) by mouth 3 (three) times daily. Take during menses for a maximum of five days 08/13/21   Lesly DukesLeggett, Kelly H, MD    Family History Family History  Problem Relation Age of Onset   Thyroid disease Maternal Aunt    Stroke Maternal Grandmother    Hypertension Maternal Grandmother    Asthma Mother    Healthy Father     Social History Social History   Tobacco Use   Smoking status: Never   Smokeless tobacco: Never  Vaping Use   Vaping Use: Never used  Substance Use Topics   Alcohol use: Not Currently   Drug use: Never     Allergies   Tree extract, Augmentin [amoxicillin-pot clavulanate], and Peanut-containing drug products   Review of Systems Review of Systems  Constitutional:  Positive for activity change. Negative for appetite change, fatigue and fever.  HENT:  Positive for congestion and ear pain. Negative for postnasal drip, sinus pressure, sneezing and sore throat.   Respiratory:  Negative for cough and shortness of breath.   Cardiovascular:  Negative for chest pain.  Gastrointestinal:  Negative for abdominal pain, diarrhea, nausea and vomiting.  Neurological:  Positive for dizziness. Negative for light-headedness and headaches.    Physical Exam Triage Vital Signs ED Triage Vitals  Enc Vitals Group     BP 08/13/21 1456 123/85     Pulse Rate 08/13/21 1456 79     Resp 08/13/21 1456 18     Temp 08/13/21 1456 98.1 F (36.7 C)     Temp Source 08/13/21 1456 Oral     SpO2 08/13/21 1456 97 %     Weight --      Height --      Head Circumference --      Peak Flow --      Pain Score 08/13/21 1455 0     Pain Loc --      Pain Edu? --      Excl. in GC? --    No data found.  Updated Vital Signs BP 123/85 (BP Location: Left Arm)    Pulse 79    Temp 98.1 F (36.7 C) (Oral)    Resp 18    LMP 07/24/2021    SpO2 97%   Visual Acuity Right Eye Distance:   Left Eye Distance:   Bilateral  Distance:    Right Eye Near:   Left Eye Near:    Bilateral Near:     Physical Exam Vitals reviewed.  Constitutional:  General: She is awake. She is not in acute distress.    Appearance: Normal appearance. She is well-developed. She is not ill-appearing.     Comments: Very pleasant female appears stated age in no acute distress sitting comfortably in exam room  HENT:     Head: Normocephalic and atraumatic.     Right Ear: Ear canal and external ear normal. A middle ear effusion is present. Tympanic membrane is not erythematous or bulging.     Left Ear: Ear canal and external ear normal. A middle ear effusion is present. Tympanic membrane is not erythematous or bulging.     Ears:     Comments: Effusion noted bilaterally.  Small amount of wax noted inferior portion of right ear canal.  No cyst or lesion noted.  TM intact without evidence of infection bilaterally    Nose:     Right Sinus: No maxillary sinus tenderness or frontal sinus tenderness.     Left Sinus: No maxillary sinus tenderness or frontal sinus tenderness.     Mouth/Throat:     Pharynx: Uvula midline. No oropharyngeal exudate or posterior oropharyngeal erythema.  Cardiovascular:     Rate and Rhythm: Normal rate and regular rhythm.     Heart sounds: Normal heart sounds, S1 normal and S2 normal. No murmur heard. Pulmonary:     Effort: Pulmonary effort is normal.     Breath sounds: Normal breath sounds. No wheezing, rhonchi or rales.     Comments: Clear to auscultation bilaterally Psychiatric:        Behavior: Behavior is cooperative.     UC Treatments / Results  Labs (all labs ordered are listed, but only abnormal results are displayed) Labs Reviewed - No data to display  EKG   Radiology No results found.  Procedures Procedures (including critical care time)  Medications Ordered in UC Medications - No data to display  Initial Impression / Assessment and Plan / UC Course  I have reviewed the triage vital  signs and the nursing notes.  Pertinent labs & imaging results that were available during my care of the patient were reviewed by me and considered in my medical decision making (see chart for details).     No evidence of acute infection on physical exam that would warrant initiation of antibiotics.  TMs appear normal but patient does have effusion bilaterally.  She was encouraged to continue allergy medication as prescribed and recommended follow-up with ENT given persistent symptoms.  She was given contact information for local ENT provider and encouraged to follow-up as soon as possible.  We will start meclizine to help with vertigo symptoms we discussed that this is an antihistamine that can increase the risk of anticholinergic side effects when combined with Xyzal and she should monitor for any concerning symptoms.  Also discussed that she should not take additional antihistamines with this medication.  She was encouraged to rest and drink plenty of fluid.  Discussed that symptoms are similar to BPPV and she may benefit from Epley maneuver and so was given instruction on how to perform this at home.  Discussed that if she has any worsening symptoms including high fever, difficulty hearing, increased pain, bloody otorrhea, lightheadedness, near-syncope she is to go to the emergency room.  Strict return precautions given to which she expressed understanding.  Final Clinical Impressions(s) / UC Diagnoses   Final diagnoses:  Otalgia of both ears  Vertigo     Discharge Instructions      I do see fluid  behind your ears but do not see anything concerning or that would explain your symptoms.  Please start meclizine up to 3 times a day as needed for nausea and dizziness symptoms.  This is an antihistamine so monitor for side effects when combined with your Xyzal such as difficulty urinating, dry mouth, heart racing, increased anxiety.  Do not take additional antihistamines such as Benadryl with this  medication.  Use the Epley maneuver in your handout to try to manage vertigo.  I do think it is reasonable to follow-up with an ENT so please call to schedule an appointment.  If you have any worsening symptoms please return for reevaluation.    ED Prescriptions     Medication Sig Dispense Auth. Provider   meclizine (ANTIVERT) 25 MG tablet Take 1 tablet (25 mg total) by mouth 3 (three) times daily as needed for dizziness. 30 tablet Atzel Mccambridge, Noberto RetortErin K, PA-C      PDMP not reviewed this encounter.   Jeani HawkingRaspet, Ladavia K, PA-C 08/13/21 1519

## 2021-08-13 NOTE — Progress Notes (Addendum)
Subjective:     Alexandra Trujillo is a 31 y.o. female here for a routine exam.  Current complaints: menorrhagia (see note from December).  Patient also complaining of pressure in right ear.  She was told it was swollen [place] at her last primary care doctor visit.  She would like me to look at her ear.   Gynecologic History Patient's last menstrual period was 07/24/2021. Contraception: husband had vasectomy Last Pap: 08/30/17. Results were: normal Last mammogram: n/a due to age  Obstetric History OB History  Gravida Para Term Preterm AB Living  4 4 4     4   SAB IAB Ectopic Multiple Live Births        0 4    # Outcome Date GA Lbr Len/2nd Weight Sex Delivery Anes PTL Lv  4 Term 07/18/14 [redacted]w[redacted]d 14:14 / 00:07 7 lb 3.3 oz (3.27 kg) F Vag-Spont EPI  LIV  3 Term 03/07/11 [redacted]w[redacted]d  7 lb 8 oz (3.402 kg) M Vag-Spont EPI  LIV     Birth Comments: No lacerations  2 Term 11/14/08 [redacted]w[redacted]d  7 lb 2 oz (3.232 kg) M Vag-Spont EPI  LIV     Birth Comments: small lac  1 Term 09/02/07 [redacted]w[redacted]d  7 lb 4 oz (3.289 kg) M Vag-Spont EPI  LIV     Birth Comments: 4th degree episiotomy     The following portions of the patient's history were reviewed and updated as appropriate: allergies, current medications, past family history, past medical history, past social history, past surgical history, and problem list.  Review of Systems Pertinent items noted in HPI and remainder of comprehensive ROS otherwise negative.    Objective:     Vitals:   08/13/21 1338  BP: 136/83  Pulse: 81  Weight: 151 lb (68.5 kg)  Height: 4\' 11"  (1.499 m)   Vitals:  WNL General appearance: alert, cooperative and no distress  HEENT: Normocephalic, without obvious abnormality, atraumatic Eyes: negative Throat: lips, mucosa, and tongue normal; teeth and gums normal Ears:  Right external canal has cyst that encompasses most of canal. Small amount of dried blood below Cannot see TM.  Left TM is full.  Respiratory: Clear to auscultation  bilaterally  CV: Regular rate and rhythm  Breasts:  Normal appearance, no masses or tenderness, no nipple retraction or dimpling  GI: Soft, non-tender; bowel sounds normal; no masses,  no organomegaly  GU: External Genitalia:  Tanner V, no lesion Urethra:  No prolapse   Vagina: Pink, normal rugae, no blood or discharge  Cervix: No CMT, no lesion  Uterus:  Normal size and contour, non tender  Adnexa: Normal, no masses, non tender  Musculoskeletal: No edema, redness or tenderness in the calves or thighs  Skin: No lesions or rash  Lymphatic: Axillary adenopathy: none     Psychiatric: Normal mood and behavior        Assessment:    Healthy female exam.    Plan:    PAP smear with co-testing Reveiwed 10/11/21 and discussed fibroid vs pseudotumor and nabothian cyst.  Pseudotumor is usually benign but can be associated with endometrial hyperplasia.  We discussed MRI versus endometrial biopsy.  Patient would like to proceed with MRI.   Menorrhagia--patient does not want any form of hormonal birth control or IUD.  We discussed Lysteda and she would like to try that.  It is mostly the first day or 2 of her period that causes disruption of activities of daily living.  Alexandra Trujillo is aware of  the slight risk of DVT. Patient to follow PCP for right external ear canal issue.  I suggested she take a decongestant to help with the left TM and ear fullness.

## 2021-08-13 NOTE — Discharge Instructions (Signed)
I do see fluid behind your ears but do not see anything concerning or that would explain your symptoms.  Please start meclizine up to 3 times a day as needed for nausea and dizziness symptoms.  This is an antihistamine so monitor for side effects when combined with your Xyzal such as difficulty urinating, dry mouth, heart racing, increased anxiety.  Do not take additional antihistamines such as Benadryl with this medication.  Use the Epley maneuver in your handout to try to manage vertigo.  I do think it is reasonable to follow-up with an ENT so please call to schedule an appointment.  If you have any worsening symptoms please return for reevaluation.

## 2021-08-13 NOTE — ED Triage Notes (Addendum)
Pt reports having right ear pain, she states when cleaning ear today she noticed there was blood. She is having left sided otalgia with vertigo episodes (per patient). Pt reports her GYN stated she has a cyst in her right ear today.

## 2021-08-14 ENCOUNTER — Other Ambulatory Visit: Payer: Self-pay

## 2021-08-14 DIAGNOSIS — N888 Other specified noninflammatory disorders of cervix uteri: Secondary | ICD-10-CM

## 2021-08-14 NOTE — Progress Notes (Signed)
MRI ordered placed per Dr.Leggett

## 2021-08-18 LAB — CYTOLOGY - PAP
Comment: NEGATIVE
High risk HPV: POSITIVE — AB

## 2021-08-31 ENCOUNTER — Other Ambulatory Visit: Payer: Self-pay

## 2021-08-31 ENCOUNTER — Encounter: Payer: Self-pay | Admitting: Obstetrics & Gynecology

## 2021-08-31 ENCOUNTER — Other Ambulatory Visit (HOSPITAL_COMMUNITY)
Admission: RE | Admit: 2021-08-31 | Discharge: 2021-08-31 | Disposition: A | Discharge status: Home / Self Care | Payer: BC Managed Care – PPO | Source: Ambulatory Visit | Attending: Obstetrics & Gynecology | Admitting: Obstetrics & Gynecology

## 2021-08-31 ENCOUNTER — Ambulatory Visit: Payer: BC Managed Care – PPO | Admitting: Obstetrics & Gynecology

## 2021-08-31 VITALS — BP 127/86 | HR 71 | Resp 16 | Ht 59.0 in | Wt 151.0 lb

## 2021-08-31 DIAGNOSIS — Z01812 Encounter for preprocedural laboratory examination: Secondary | ICD-10-CM | POA: Diagnosis not present

## 2021-08-31 DIAGNOSIS — Z23 Encounter for immunization: Secondary | ICD-10-CM | POA: Diagnosis not present

## 2021-08-31 DIAGNOSIS — N888 Other specified noninflammatory disorders of cervix uteri: Secondary | ICD-10-CM | POA: Diagnosis not present

## 2021-08-31 DIAGNOSIS — N870 Mild cervical dysplasia: Secondary | ICD-10-CM

## 2021-08-31 LAB — POCT URINE PREGNANCY: Preg Test, Ur: NEGATIVE

## 2021-08-31 NOTE — Progress Notes (Signed)
Subjective:    Patient ID: Alexandra Trujillo, female    DOB: 09-13-1990, 31 y.o.   MRN: [id number]  HPI  31 year old G66 P67-0-0-4 female presents for colposcopy endometrial biopsy.  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  After speaking with GYN oncology, MRI was recommended.  Unfortunately [employer] has not approved the MRI after a month.  She is elected to go with endometrial biopsy a pseudotumor can be associated with endometrial hyperplasia.  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  He does have a new partner from the past year.  Patient has negative Pap smears with 2015 and 2019.  Review of Systems  Constitutional: Negative.   Respiratory: Negative.    Cardiovascular: Negative.   Gastrointestinal: Negative.   Genitourinary: Negative.       Objective:   Physical Exam Vitals reviewed.  Constitutional:      General: She is not in acute distress.    Appearance: She is well-developed.  HENT:     Head: Normocephalic and atraumatic.  Eyes:     Conjunctiva/sclera: Conjunctivae normal.  Cardiovascular:     Rate and Rhythm: Normal rate.  Pulmonary:     Effort: Pulmonary effort is normal.  Skin:    General: Skin is warm and dry.  Neurological:     Mental Status: She is alert and oriented to person, place, and time.  Psychiatric:        Mood and Affect: Mood normal.   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  Patient also for colposcopy for low-grade Pap smear and positive HPV.  Please see separate procedure note for those 2 procedures.   Colposcopy Procedure Note  Indications: Pap smear 0 months ago showed: low-grade squamous intraepithelial neoplasia  (LGSIL - encompassing HPV,mild dysplasia,CIN I). The prior pap showed no abnormalities.  Prior cervical/vaginal disease: normal exam without visible pathology. Prior cervical treatment: no treatment.  Procedure Details  The risks and benefits of the procedure and Written informed consent obtained.  Speculum placed in vagina and excellent visualization of cervix achieved, cervix swabbed x 3 with acetic acid solution.  Findings: Cervix: acetowhite lesion(s) noted at 11-1 and 3 o'clock; cervix swabbed with Lugol's solution, cervical biopsies taken at 12 & 3 o'clock, ECC, specimen labelled and sent to pathology, and hemostasis achieved with Monsel's solution. Vaginal inspection: vaginal colposcopy not performed. Vulvar colposcopy: vulvar colposcopy not performed.  Specimens: ECC, cervical biopsies at 12 and 3  Complications: none.  Plan: Specimens labelled and sent to Pathology. Will base further treatment on Pathology findings. Post biopsy instructions given to patient.  ENDOMETRIAL BIOPSY     The indications for endometrial biopsy were reviewed.   Risks of the biopsy including cramping, bleeding, infection, uterine perforation, inadequate specimen and need for additional procedures  were discussed. The patient states she understands and agrees to undergo procedure today. Consent was signed. Time out was performed. Urine HCG was negative. A sterile speculum was placed in the patient's vagina and the cervix was prepped with Betadine. A single-toothed tenaculum was placed on the anterior lip of the cervix to stabilize it. The 3 mm pipelle was introduced into  the endometrial cavity without difficulty to a depth of 8 cm, and a moderate amount of tissue was obtained and sent to pathology. The instruments were removed from the patient's vagina. Minimal bleeding from the cervix was noted. The patient tolerated the procedure well. Routine post-procedure instructions were given to the patient. The patient  will follow up to review the results and for further management.

## 2021-08-31 NOTE — Addendum Note (Signed)
Addended by: Granville Lewis on: 08/31/2021 03:46 PM   Modules accepted: Orders

## 2021-09-02 LAB — SURGICAL PATHOLOGY

## 2021-09-07 ENCOUNTER — Encounter (HOSPITAL_BASED_OUTPATIENT_CLINIC_OR_DEPARTMENT_OTHER): Payer: Self-pay | Admitting: Family Medicine

## 2021-09-07 ENCOUNTER — Other Ambulatory Visit: Payer: Self-pay

## 2021-09-07 ENCOUNTER — Ambulatory Visit (INDEPENDENT_AMBULATORY_CARE_PROVIDER_SITE_OTHER): Payer: BC Managed Care – PPO

## 2021-09-07 DIAGNOSIS — N888 Other specified noninflammatory disorders of cervix uteri: Secondary | ICD-10-CM

## 2021-09-07 DIAGNOSIS — Z0389 Encounter for observation for other suspected diseases and conditions ruled out: Secondary | ICD-10-CM | POA: Diagnosis not present

## 2021-09-07 MED ORDER — GADOBUTROL 1 MMOL/ML IV SOLN
7.0000 mL | Freq: Once | INTRAVENOUS | Status: AC | PRN
  Administered 2021-09-07: 7 mL via INTRAVENOUS

## 2021-09-09 ENCOUNTER — Other Ambulatory Visit: Payer: Self-pay

## 2021-09-09 ENCOUNTER — Encounter (HOSPITAL_BASED_OUTPATIENT_CLINIC_OR_DEPARTMENT_OTHER): Payer: Self-pay | Admitting: Family Medicine

## 2021-09-09 ENCOUNTER — Encounter: Payer: Self-pay | Admitting: Obstetrics & Gynecology

## 2021-09-09 ENCOUNTER — Telehealth (INDEPENDENT_AMBULATORY_CARE_PROVIDER_SITE_OTHER): Payer: BC Managed Care – PPO | Admitting: Family Medicine

## 2021-09-09 DIAGNOSIS — E669 Obesity, unspecified: Secondary | ICD-10-CM | POA: Diagnosis not present

## 2021-09-09 MED ORDER — SEMAGLUTIDE(0.25 OR 0.5MG/DOS) 2 MG/1.5ML ~~LOC~~ SOPN: PEN_INJECTOR | SUBCUTANEOUS | 0 refills | Status: DC

## 2021-09-09 NOTE — Assessment & Plan Note (Signed)
Patient presents with concerns of elevated BMI despite lifestyle modifications.  She endorses many years of aggressive lifestyle modifications focusing on diet and exercise and despite this continues to have elevated BMI with inability to have sufficient weight loss to achieve normal BMI.  Due to persistent elevated BMI, she is concerned about risk for chronic medical issues in the future as well as current musculoskeletal complaints including lower extremity joint pain.  She is desiring to initiate pharmacotherapy for treatment of obesity.  Specifically, she has researched and is interested in use of injectable options. Did discuss treatment options to work on promoting weight loss to achieve improved BMI.  Discussed potential barriers, risk, side effects related to GLP-1 receptor agonist use.  Patient wishes to proceed with this treatment Prescription sent to pharmacy for once weekly semaglutide.  Discussed that typically this is started at a low dose and titrated gradually over time in order to minimize side effects, most notably GI side effects which can include nausea, vomiting. She will continue with lifestyle modifications We will plan for follow-up in about 4 weeks to monitor progress with medication, consider titration to next dose if tolerating well She will let us know if there is any issue in obtaining medication whether due to supply shortage or if it is cost prohibitive

## 2021-09-09 NOTE — Progress Notes (Signed)
° °  Virtual Visit via Telephone   I connected with  Alexandra Trujillo  on 09/09/21 by telephone/telehealth and verified that I am speaking with the correct person using two identifiers.   I discussed the limitations, risks, security and privacy concerns of performing an evaluation and management service by telephone, including the higher likelihood of inaccurate diagnosis and treatment, and the availability of in person appointments.  We also discussed the likely need of an additional face to face encounter for complete and high quality delivery of care.  I also discussed with the patient that there may be a patient responsible charge related to this service. The patient expressed understanding and wishes to proceed.  Provider location is in medical facility. Patient location is at their home, different from provider location. People involved in care of the patient during this telehealth encounter were myself, my nurse/medical assistant, and my front office/scheduling team member.  Review of Systems: No fevers, chills, night sweats, weight loss, chest pain, or shortness of breath.   Objective Findings:    General: Speaking full sentences, no audible heavy breathing.  Sounds alert and appropriately interactive.    Independent interpretation of tests performed by another provider:   None.  Brief History, Exam, Impression, and Recommendations:    Obesity, Class I, BMI 30-34.9 Patient presents with concerns of elevated BMI despite lifestyle modifications.  She endorses many years of aggressive lifestyle modifications focusing on diet and exercise and despite this continues to have elevated BMI with inability to have sufficient weight loss to achieve normal BMI.  Due to persistent elevated BMI, she is concerned about risk for chronic medical issues in the future as well as current musculoskeletal complaints including lower extremity joint pain.  She is desiring to initiate pharmacotherapy for  treatment of obesity.  Specifically, she has researched and is interested in use of injectable options. Did discuss treatment options to work on promoting weight loss to achieve improved BMI.  Discussed potential barriers, risk, side effects related to GLP-1 receptor agonist use.  Patient wishes to proceed with this treatment Prescription sent to pharmacy for once weekly semaglutide.  Discussed that typically this is started at a low dose and titrated gradually over time in order to minimize side effects, most notably GI side effects which can include nausea, vomiting. She will continue with lifestyle modifications We will plan for follow-up in about 4 weeks to monitor progress with medication, consider titration to next dose if tolerating well She will let us know if there is any issue in obtaining medication whether due to supply shortage or if it is cost prohibitive  I discussed the above assessment and treatment plan with the patient. The patient was provided an opportunity to ask questions and all were answered. The patient agreed with the plan and demonstrated an understanding of the instructions.   The patient was advised to call back or seek an in-person evaluation if the symptoms worsen or if the condition fails to improve as anticipated.   I provided 10 minutes of face to face and non-face-to-face time during this encounter date, time was needed to gather information, review chart, records, communicate/coordinate with staff remotely, as well as complete documentation.   ___________________________________________ Rakeen Gaillard de [place], MD, ABFM, CAQSM [facility] [place]

## 2021-09-17 ENCOUNTER — Encounter: Payer: Self-pay | Admitting: Obstetrics & Gynecology

## 2021-09-17 ENCOUNTER — Encounter (HOSPITAL_BASED_OUTPATIENT_CLINIC_OR_DEPARTMENT_OTHER): Payer: Self-pay | Admitting: Family Medicine

## 2021-09-17 DIAGNOSIS — N870 Mild cervical dysplasia: Secondary | ICD-10-CM | POA: Insufficient documentation

## 2021-09-17 DIAGNOSIS — N920 Excessive and frequent menstruation with regular cycle: Secondary | ICD-10-CM | POA: Insufficient documentation

## 2021-09-21 MED ORDER — LIRAGLUTIDE 18 MG/3ML ~~LOC~~ SOPN: 1.8000 mg | PEN_INJECTOR | Freq: Every day | SUBCUTANEOUS | 0 refills | Status: DC

## 2021-09-23 ENCOUNTER — Telehealth (HOSPITAL_BASED_OUTPATIENT_CLINIC_OR_DEPARTMENT_OTHER): Payer: Self-pay

## 2021-09-23 NOTE — Telephone Encounter (Signed)
Received denial from [facility] regarding PA for Victoza. Placed in basket for Dr. de Peru to review and advise

## 2021-09-24 ENCOUNTER — Telehealth: Payer: BC Managed Care – PPO | Admitting: Physician Assistant

## 2021-09-24 DIAGNOSIS — A084 Viral intestinal infection, unspecified: Secondary | ICD-10-CM

## 2021-09-24 MED ORDER — ONDANSETRON 4 MG PO TBDP: 4.0000 mg | ORAL_TABLET | Freq: Three times a day (TID) | ORAL | 0 refills | Status: DC | PRN

## 2021-09-24 NOTE — Progress Notes (Signed)

## 2021-09-24 NOTE — Progress Notes (Signed)
I have spent 5 minutes in review of e-visit questionnaire, review and updating patient chart, medical decision making and response to patient.   Ferdie Bakken Cody Cledith Abdou, PA-C    

## 2021-09-30 NOTE — Progress Notes (Signed)
GYNECOLOGY OFFICE VISIT NOTE  History:   Alexandra Trujillo is a 31 y.o. [phone] here today for consultation regarding options for heavy vaginal bleeding. She describes her bleeding as extremely heavy on day 1 of her cycle such that she has to change a tampon q10 min and passes an orange sized clot. Day 2 reduces to Q1 hr bleeding and day 3-4 are normal and then it stops.  EMB was wnl in January. She had a normal pelvic MRI.   Her gyne history is also complicated by menstrual migraines vs migraine with aura.   For her bleeding she has tried an IUD and OCPs. She was recently given TXA and she not yet tried it.   She had a history of normal paps until recently when she had LSIL/HPV pos. She had colposcopy which was CIN1 on bx and ECC negative.   She is done with child-bearing.   She denies any abnormal vaginal discharge, bleeding, pelvic pain or other concerns.     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     Past Surgical History:  Procedure Laterality Date   IUD REMOVAL  2012   malposition   LAPAROSCOPY     to find IUD & remove it    The following portions of the patient's history were reviewed and updated as appropriate: allergies, current medications, past family history, past medical history, past social history, past surgical history and problem list.   Health Maintenance:   Diagnosis  Date Value Ref Range Status  08/13/2021 - Low grade squamous intraepithelial lesion (LSIL) (A)  Final    Review of Systems:  Pertinent items noted in HPI and remainder of comprehensive ROS otherwise negative.  Physical Exam:  BP 104/66    Pulse 60    Ht 4\' 11"  (1.499 m)    Wt 149 lb (67.6 kg)    BMI 30.09  kg/m  CONSTITUTIONAL: Well-developed, well-nourished female in no acute distress.  HEENT:  Normocephalic, atraumatic. External right and left ear normal. No scleral icterus.  NECK: Normal range of motion, supple, no masses noted on observation SKIN: No rash noted. Not diaphoretic. No erythema. No pallor. MUSCULOSKELETAL: Normal range of motion. No edema noted. NEUROLOGIC: Alert and oriented to person, place, and time. Normal muscle tone coordination. No cranial nerve deficit noted. PSYCHIATRIC: Normal mood and affect. Normal behavior. Normal judgment and thought content.  CARDIOVASCULAR: Normal heart rate noted RESPIRATORY: Effort and breath sounds normal, no problems with respiration noted ABDOMEN: No masses noted. No other overt distention noted.    PELVIC: Deferred  Labs and Imaging No results found for this or any previous visit (from the past 168 hour(s)). MR PELVIS W WO CONTRAST  Result Date: 09/08/2021 CLINICAL DATA:  Possible uterine mass identified by prior ultrasound EXAM: MRI PELVIS WITHOUT AND WITH CONTRAST TECHNIQUE: Multiplanar multisequence MR imaging of the pelvis was performed both before and after administration of intravenous contrast. CONTRAST:  2mL GADAVIST GADOBUTROL 1 MMOL/ML IV SOLN COMPARISON:  Pelvic ultrasound, 07/28/2021 FINDINGS: Urinary Tract:  No abnormality visualized. Bowel:  Unremarkable visualized pelvic bowel loops. Vascular/Lymphatic: No pathologically enlarged lymph nodes. No significant vascular abnormality seen. Reproductive: No mass  or other significant abnormality. Specifically, no evidence of uterine mass with attention to the anterior uterus. The junctional zone is normal, measuring 0.7 cm in thickness. Incidental subcentimeter nabothian cyst of the cervix. Corpus luteum of the right ovary. Other:  None. Musculoskeletal: No suspicious bone lesions identified. IMPRESSION: No evidence of uterine mass or other abnormality with attention to the anterior  uterus. Electronically Signed   By: Jearld Lesch M.D.   On: 09/08/2021 14:11    Assessment and Plan:   1. Menorrhagia with regular cycle - She declines medical options for her cycle. She has tried Nexplanon (btb), OCPs (N/V) and IUD caused perforation. She also had another attempt at IUD but unable to bypass her cervix.  - We discussed surgical options available for her bleeding: ablation, [place], and hysterectomy.  - Discussed the risks of ablation: lack of success, need for hysterectomy in the future, post-ablation tubal syndrome. She would like salpingectomy at the time of her surgery as she is done with child-bearing. While there is the risk of post-ablation tubal syndrome, she would be at far great risk of she got pregnant following an ablation. Reviewed process and risks associated with L/S salpingectomy. Reviewed limited restrictions following the procedure. Discussed she would be at higher risk for needing hysterectomy in the future due to her young age.  - Discussed the risks of [place]: lack of success, still need for hysterectomy. Additionally discussed pregnancy would be unsafe following [place] so some form of prevention would be advised.  - Discussed risk of hysterectomy (she would be for [facility]): bleeding, infection, injury to surrounding organs/tissue (bowel/bladder/ureter). Discussed impact on age of menopause, theoretical impact on sexual function, and risk of prolapse later in life.  - Reviewed none of these procedures will help her menstrual migraines and we discussed her abnormal pap impact with each of these and that none of them would preclude her from screening for some years after, including hysterectomy although presumably it would lower her risk. She is in the process of Gardasil which I think is best. Reviewed at this time, given low grade pap, would do cotesting and only intervene if high grade or persistently abnormal.  - After our discussion of surgical options, she would like to do  ablation with salpingectomy. Message sent to [facility] to schedule her surgery.   2. Low grade squamous intraepithelial lesion on cytologic smear of cervix (LGSIL) - Cotest in one year  3. Menstrual migaines - Continue management as per primary care doctor  Routine preventative health maintenance measures emphasized. Please refer to After Visit Summary for other counseling recommendations.   No follow-ups on file.  Milas Hock, MD, FACOG Obstetrician & Gynecologist, [facility], [facility]

## 2021-10-01 ENCOUNTER — Ambulatory Visit: Payer: BC Managed Care – PPO | Admitting: Obstetrics and Gynecology

## 2021-10-01 ENCOUNTER — Encounter: Payer: Self-pay | Admitting: Obstetrics and Gynecology

## 2021-10-01 ENCOUNTER — Other Ambulatory Visit: Payer: Self-pay

## 2021-10-01 VITALS — BP 104/66 | HR 60 | Ht 59.0 in | Wt 149.0 lb

## 2021-10-01 DIAGNOSIS — N920 Excessive and frequent menstruation with regular cycle: Secondary | ICD-10-CM

## 2021-10-01 DIAGNOSIS — R87612 Low grade squamous intraepithelial lesion on cytologic smear of cervix (LGSIL): Secondary | ICD-10-CM

## 2021-10-15 NOTE — H&P (Signed)
Faculty Practice Obstetrics and Gynecology Attending History and Physical ? ?Alexandra Trujillo is a 31 y.o. [phone] here today for consultation regarding options for heavy vaginal bleeding. She describes her bleeding as extremely heavy on day 1 of her cycle such that she has to change a tampon q10 min and passes an orange sized clot. Day 2 reduces to Q1 hr bleeding and day 3-4 are normal and then it stops.  EMB was wnl in January. She had a normal pelvic MRI.  ?  ?Her gyne history is also complicated by menstrual migraines vs migraine with aura.  ? ?For her bleeding she has tried an IUD and OCPs. She was recently given TXA and she not yet tried it.  ?  ?She had a history of normal paps until recently when she had LSIL/HPV pos. She had colposcopy which was CIN1 on bx and ECC negative.  ?  ?She is done with child-bearing.  ? ?Past Medical History:  ?Diagnosis Date  ? Allergy   ? Anxiety   ? Asthma   ? COVID-19   ? Hx of maternal laceration, 4th degree, currently pregnant 02/11/2014  ? 1st pregnancy, no laceration on 3rd baby   ? Menstrual migraine   ? PMDD (premenstrual dysphoric disorder)   ? Uterine perforation by intrauterine contraceptive device   ? ?Past Surgical History:  ?Procedure Laterality Date  ? IUD REMOVAL  2012  ? malposition  ? LAPAROSCOPY    ? to find IUD & remove it  ? ?OB History  ?Gravida Para Term Preterm AB Living  ?4 4 4     4   ?SAB IAB Ectopic Multiple Live Births  ?      0 4  ?  ?# Outcome Date GA Lbr Len/2nd Weight Sex Delivery Anes PTL Lv  ?4 Term 07/18/14 [redacted]w[redacted]d 14:14 / 00:07 3270 g F Vag-Spont EPI  LIV  ?3 Term 03/07/11 [redacted]w[redacted]d  3402 g M Vag-Spont EPI  LIV  ?   Birth Comments: No lacerations  ?2 Term 11/14/08 [redacted]w[redacted]d  3232 g M Vag-Spont EPI  LIV  ?   Birth Comments: small lac  ?1 Term 09/02/07 [redacted]w[redacted]d  3289 g M Vag-Spont EPI  LIV  ?   Birth Comments: 4th degree episiotomy  ?Patient denies any other pertinent gynecologic issues.  ?No current facility-administered medications on file prior to  encounter.  ? ?Current Outpatient Medications on File Prior to Encounter  ?Medication Sig Dispense Refill  ? albuterol (VENTOLIN HFA) 108 (90 Base) MCG/ACT inhaler Inhale 2 puffs into the lungs every 6 (six) hours as needed for wheezing or shortness of breath. 8 g 0  ? escitalopram (LEXAPRO) 20 MG tablet TAKE 1 TABLET(20 MG) BY MOUTH DAILY 90 tablet 1  ? ibuprofen (ADVIL,MOTRIN) 200 MG tablet Take 400 mg by mouth every 6 (six) hours as needed for moderate pain.    ? levocetirizine (XYZAL) 5 MG tablet Take 5 mg by mouth every evening.    ? lidocaine (XYLOCAINE) 2 % solution Use as directed 15 mLs in the mouth or throat as needed for mouth pain. Apply to canker sores in mouth using cotton tipped applicator as needed. 100 mL 0  ? liraglutide (VICTOZA) 18 MG/3ML SOPN Inject 1.8 mg into the skin daily. 9 mL 0  ? montelukast (SINGULAIR) 10 MG tablet Take 1 tablet (10 mg total) by mouth at bedtime. 90 tablet 3  ? ondansetron (ZOFRAN-ODT) 4 MG disintegrating tablet Take 1 tablet (4 mg total) by mouth every 8 (  eight) hours as needed for nausea or vomiting. 20 tablet 0  ? propranolol (INDERAL) 10 MG tablet TAKE 1 TABLET(10 MG) BY MOUTH TWICE DAILY 180 tablet 1  ? rizatriptan (MAXALT-MLT) 5 MG disintegrating tablet Take 1 tablet (5 mg total) by mouth as needed for migraine. May repeat in 2 hours if needed. Needs appointment. 10 tablet 0  ? tranexamic acid (LYSTEDA) 650 MG TABS tablet Take 2 tablets (1,300 mg total) by mouth 3 (three) times daily. Take during menses for a maximum of five days 30 tablet 2  ? ?Allergies  ?Allergen Reactions  ? Tree Extract Anaphylaxis, Itching and Other (See Comments)  ?  Plant based food, etc  ? Augmentin [Amoxicillin-Pot Clavulanate] Hives  ? Peanut-Containing Drug Products Other (See Comments)  ?  Headache, sores in mouth  ? ? ?Social History:   reports that she has never smoked. She has never used smokeless tobacco. She reports that she does not currently use alcohol. She reports that she  does not use drugs. ?Family History  ?Problem Relation Age of Onset  ? Thyroid disease Maternal Aunt   ? Stroke Maternal Grandmother   ? Hypertension Maternal Grandmother   ? Asthma Mother   ? Healthy Father   ? ? ?Review of Systems: Pertinent items noted in HPI and remainder of comprehensive ROS otherwise negative. ? ?PHYSICAL EXAM: ?BP 104/66   Pulse 60   Ht 4\' 11"  (1.499 m)   Wt 149 lb (67.6 kg)   BMI 30.09 kg/m?  ?CONSTITUTIONAL: Well-developed, well-nourished female in no acute distress.  ?HENT:  Normocephalic, atraumatic, External right and left ear normal. Oropharynx is clear and moist ?EYES: Conjunctivae and EOM are normal. Pupils are equal, round, and reactive to light. No scleral icterus.  ?NECK: Normal range of motion, supple, no masses ?SKIN: Skin is warm and dry. No rash noted. Not diaphoretic. No erythema. No pallor. ?NEUROLOGIC: Alert and oriented to person, place, and time. Normal reflexes, muscle tone coordination. No cranial nerve deficit noted. ?PSYCHIATRIC: Normal mood and affect. Normal behavior. Normal judgment and thought content. ?CARDIOVASCULAR: Normal heart rate noted, regular rhythm ?RESPIRATORY: Effort and breath sounds normal, no problems with respiration noted ?ABDOMEN: Soft, nontender, nondistended. ?PELVIC: Deferred  ?MUSCULOSKELETAL: Normal range of motion. No tenderness.  No cyanosis, clubbing, or edema.  2+ distal pulses. ? ?Labs: ?No results found for this or any previous visit (from the past 336 hour(s)). ? ?Imaging Studies: ?No results found. ? ?Assessment: ?Active Problems: ?  Menorrhagia ? Undesired fertility ? ?Plan: ?- She declines medical options for her cycle. She has tried Nexplanon (btb), OCPs (N/V) and IUD caused perforation. She also had another attempt at IUD but unable to bypass her cervix.  ?- We discussed surgical options available for her bleeding: ablation, [place], and hysterectomy.  ?- Discussed the risks of ablation: lack of success, need for hysterectomy in  the future, post-ablation tubal syndrome. She would like salpingectomy at the time of her surgery as she is done with child-bearing. While there is the risk of post-ablation tubal syndrome, she would be at far great risk of she got pregnant following an ablation. Reviewed process and risks associated with L/S salpingectomy. Reviewed limited restrictions following the procedure. Discussed she would be at higher risk for needing hysterectomy in the future due to her young age.  ?- Discussed the risks of [place]: lack of success, still need for hysterectomy. Additionally discussed pregnancy would be unsafe following [place] so some form of prevention would be advised.  ?-  Discussed risk of hysterectomy (she would be for [facility]): bleeding, infection, injury to surrounding organs/tissue (bowel/bladder/ureter). Discussed impact on age of menopause, theoretical impact on sexual function, and risk of prolapse later in life.  ?- Reviewed none of these procedures will help her menstrual migraines and we discussed her abnormal pap impact with each of these and that none of them would preclude her from screening for some years after, including hysterectomy although presumably it would lower her risk. She is in the process of Gardasil which I think is best. Reviewed at this time, given low grade pap, would do cotesting and only intervene if high grade or persistently abnormal.  ?- After our discussion of surgical options, she would like to do endometrial ablation with salpingectomy.  ? ?Milas Hock, MD, FACOG ?Obstetrician Heritage manager, Faculty Practice ?Center for [employer], [facility] ? ?

## 2021-10-29 ENCOUNTER — Encounter: Payer: Self-pay | Admitting: *Deleted

## 2021-10-29 ENCOUNTER — Ambulatory Visit (INDEPENDENT_AMBULATORY_CARE_PROVIDER_SITE_OTHER): Payer: BC Managed Care – PPO | Admitting: *Deleted

## 2021-10-29 ENCOUNTER — Other Ambulatory Visit: Payer: Self-pay

## 2021-10-29 DIAGNOSIS — Z23 Encounter for immunization: Secondary | ICD-10-CM

## 2021-10-29 NOTE — Progress Notes (Signed)
Pt here for 2nd Gardasil injection IM. ?

## 2021-11-02 ENCOUNTER — Other Ambulatory Visit: Payer: Self-pay

## 2021-11-02 ENCOUNTER — Encounter (HOSPITAL_BASED_OUTPATIENT_CLINIC_OR_DEPARTMENT_OTHER): Payer: Self-pay | Admitting: Obstetrics and Gynecology

## 2021-11-02 NOTE — Progress Notes (Signed)
Spoke w/ via phone for pre-op interview--- pt ?Lab needs dos----  urine preg, t&s             ?Lab results------ no ?COVID test -----patient states asymptomatic no test needed ?Arrive at ------- 0530 on 11-04-2021 ?NPO after MN NO Solid Food.  Clear liquids from MN until--- 0430 ?Med rec completed ?Medications to take morning of surgery ----- if needed take maxalt ?Diabetic medication ----- n/a ?Patient instructed no nail polish to be worn day of surgery ?Patient instructed to bring photo id and insurance card day of surgery ?Patient aware to have D--boyfriend, brandon ?Patient Special Instructions ----- asked to bring rescue inhaler dos ?Pre-Op special Istructions ----- n/a ?Patient verbalized understanding of instructions that were given at this phone interview. ?Patient denies shortness of breath, chest pain, fever, cough at this phone interview.  ?

## 2021-11-03 NOTE — Anesthesia Preprocedure Evaluation (Addendum)
Anesthesia Evaluation  ?Patient identified by MRN, date of birth, ID band ?Patient awake ? ? ? ?Reviewed: ?Allergy & Precautions, NPO status , Patient's Chart, lab work & pertinent test results ? ?History of Anesthesia Complications ?Negative for: history of anesthetic complications ? ?Airway ?Mallampati: II ? ?TM Distance: >3 FB ?Neck ROM: Full ? ? ? Dental ? ?(+) Dental Advisory Given, Teeth Intact ?  ?Pulmonary ?asthma ,  ?  ?Pulmonary exam normal ? ? ? ? ? ? ? Cardiovascular ?negative cardio ROS ?Normal cardiovascular exam ? ? ?  ?Neuro/Psych ? Headaches, PSYCHIATRIC DISORDERS Anxiety Depression   ? GI/Hepatic ?negative GI ROS, Neg liver ROS,   ?Endo/Other  ?negative endocrine ROS ? Renal/GU ?negative Renal ROS  ? ?  ?Musculoskeletal ? ?(+) Arthritis ,  ? Abdominal ?  ?Peds ? Hematology ?negative hematology ROS ?(+)   ?Anesthesia Other Findings ? ? Reproductive/Obstetrics ? ?  ? ? ? ? ? ? ? ? ? ? ? ? ? ?  ?  ? ? ? ? ? ? ?Anesthesia Physical ?Anesthesia Plan ? ?ASA: 2 ? ?Anesthesia Plan: General  ? ?Post-op Pain Management: Tylenol PO (pre-op)* and Celebrex PO (pre-op)*  ? ?Induction: Intravenous ? ?PONV Risk Score and Plan: 3 and Treatment may vary due to age or medical condition, Ondansetron, Dexamethasone and Midazolam ? ?Airway Management Planned: Oral ETT ? ?Additional Equipment: None ? ?Intra-op Plan:  ? ?Post-operative Plan: Extubation in OR ? ?Informed Consent: I have reviewed the patients History and Physical, chart, labs and discussed the procedure including the risks, benefits and alternatives for the proposed anesthesia with the patient or authorized representative who has indicated his/her understanding and acceptance.  ? ? ? ?Dental advisory given ? ?Plan Discussed with: CRNA and Anesthesiologist ? ?Anesthesia Plan Comments:   ? ? ? ? ? ?Anesthesia Quick Evaluation ? ?

## 2021-11-04 ENCOUNTER — Ambulatory Visit (HOSPITAL_BASED_OUTPATIENT_CLINIC_OR_DEPARTMENT_OTHER): Payer: BC Managed Care – PPO | Admitting: Anesthesiology

## 2021-11-04 ENCOUNTER — Other Ambulatory Visit: Payer: Self-pay

## 2021-11-04 ENCOUNTER — Ambulatory Visit (HOSPITAL_BASED_OUTPATIENT_CLINIC_OR_DEPARTMENT_OTHER)
Admission: RE | Admit: 2021-11-04 | Discharge: 2021-11-04 | Disposition: A | Discharge status: Home / Self Care | Payer: BC Managed Care – PPO | Attending: Obstetrics and Gynecology | Admitting: Obstetrics and Gynecology

## 2021-11-04 ENCOUNTER — Encounter (HOSPITAL_BASED_OUTPATIENT_CLINIC_OR_DEPARTMENT_OTHER): Payer: Self-pay | Admitting: Obstetrics and Gynecology

## 2021-11-04 ENCOUNTER — Encounter (HOSPITAL_BASED_OUTPATIENT_CLINIC_OR_DEPARTMENT_OTHER)
Admission: RE | Disposition: A | Discharge status: Home / Self Care | Payer: Self-pay | Source: Home / Self Care | Attending: Obstetrics and Gynecology

## 2021-11-04 DIAGNOSIS — Z3009 Encounter for other general counseling and advice on contraception: Secondary | ICD-10-CM | POA: Diagnosis not present

## 2021-11-04 DIAGNOSIS — Z302 Encounter for sterilization: Secondary | ICD-10-CM | POA: Diagnosis not present

## 2021-11-04 DIAGNOSIS — N879 Dysplasia of cervix uteri, unspecified: Secondary | ICD-10-CM | POA: Insufficient documentation

## 2021-11-04 DIAGNOSIS — F418 Other specified anxiety disorders: Secondary | ICD-10-CM | POA: Diagnosis not present

## 2021-11-04 DIAGNOSIS — N920 Excessive and frequent menstruation with regular cycle: Secondary | ICD-10-CM | POA: Diagnosis not present

## 2021-11-04 DIAGNOSIS — N939 Abnormal uterine and vaginal bleeding, unspecified: Secondary | ICD-10-CM | POA: Diagnosis not present

## 2021-11-04 HISTORY — DX: Excessive and frequent menstruation with regular cycle: N92.0

## 2021-11-04 HISTORY — PX: LAPAROSCOPIC BILATERAL SALPINGECTOMY: SHX5889

## 2021-11-04 HISTORY — DX: Chronic rhinitis: J31.0

## 2021-11-04 HISTORY — DX: Personal history of COVID-19: Z86.16

## 2021-11-04 HISTORY — PX: DILITATION & CURRETTAGE/HYSTROSCOPY WITH NOVASURE ABLATION: SHX5568

## 2021-11-04 HISTORY — DX: Unspecified osteoarthritis, unspecified site: M19.90

## 2021-11-04 HISTORY — DX: Personal history of cervical dysplasia: Z87.410

## 2021-11-04 HISTORY — DX: Other allergy status, other than to drugs and biological substances: Z91.09

## 2021-11-04 HISTORY — DX: Presence of spectacles and contact lenses: Z97.3

## 2021-11-04 HISTORY — DX: Unspecified asthma, uncomplicated: J45.909

## 2021-11-04 LAB — TYPE AND SCREEN
ABO/RH(D): A POS
Antibody Screen: NEGATIVE

## 2021-11-04 LAB — POCT PREGNANCY, URINE: Preg Test, Ur: NEGATIVE

## 2021-11-04 SURGERY — DILATATION & CURETTAGE/HYSTEROSCOPY WITH NOVASURE ABLATION
Anesthesia: General | Site: Uterus | Laterality: Bilateral

## 2021-11-04 MED ORDER — CELECOXIB 200 MG PO CAPS
ORAL_CAPSULE | ORAL | Status: AC
  Filled 2021-11-04: qty 1

## 2021-11-04 MED ORDER — DEXAMETHASONE SODIUM PHOSPHATE 10 MG/ML IJ SOLN
INTRAMUSCULAR | Status: AC
  Filled 2021-11-04: qty 1

## 2021-11-04 MED ORDER — ONDANSETRON HCL 4 MG/2ML IJ SOLN
INTRAMUSCULAR | Status: AC
  Filled 2021-11-04: qty 2

## 2021-11-04 MED ORDER — SUGAMMADEX SODIUM 200 MG/2ML IV SOLN
INTRAVENOUS | Status: DC | PRN
  Administered 2021-11-04: 200 mg via INTRAVENOUS

## 2021-11-04 MED ORDER — SODIUM CHLORIDE 0.9 % IR SOLN
Status: DC | PRN
  Administered 2021-11-04: 3000 mL

## 2021-11-04 MED ORDER — LIDOCAINE HCL (PF) 2 % IJ SOLN
INTRAMUSCULAR | Status: AC
  Filled 2021-11-04: qty 5

## 2021-11-04 MED ORDER — FENTANYL CITRATE (PF) 100 MCG/2ML IJ SOLN
INTRAMUSCULAR | Status: AC
  Filled 2021-11-04: qty 2

## 2021-11-04 MED ORDER — IBUPROFEN 800 MG PO TABS: 800.0000 mg | ORAL_TABLET | Freq: Three times a day (TID) | ORAL | 1 refills | Status: DC | PRN

## 2021-11-04 MED ORDER — PROPOFOL 10 MG/ML IV BOLUS
INTRAVENOUS | Status: AC
  Filled 2021-11-04: qty 20

## 2021-11-04 MED ORDER — ROCURONIUM BROMIDE 10 MG/ML (PF) SYRINGE
PREFILLED_SYRINGE | INTRAVENOUS | Status: DC | PRN
  Administered 2021-11-04: 50 mg via INTRAVENOUS

## 2021-11-04 MED ORDER — ONDANSETRON HCL 4 MG/2ML IJ SOLN: 4.0000 mg | Freq: Once | INTRAMUSCULAR | Status: DC | PRN

## 2021-11-04 MED ORDER — KETOROLAC TROMETHAMINE 15 MG/ML IJ SOLN: 15.0000 mg | INTRAMUSCULAR | Status: DC

## 2021-11-04 MED ORDER — OXYCODONE HCL 5 MG PO TABS
ORAL_TABLET | ORAL | Status: AC
  Filled 2021-11-04: qty 1

## 2021-11-04 MED ORDER — POVIDONE-IODINE 10 % EX SWAB: 2.0000 "application " | Freq: Once | CUTANEOUS | Status: DC

## 2021-11-04 MED ORDER — ONDANSETRON HCL 4 MG/2ML IJ SOLN
INTRAMUSCULAR | Status: DC | PRN
  Administered 2021-11-04: 4 mg via INTRAVENOUS

## 2021-11-04 MED ORDER — LACTATED RINGERS IV SOLN: INTRAVENOUS | Status: DC

## 2021-11-04 MED ORDER — ACETAMINOPHEN 500 MG PO TABS: 1000.0000 mg | ORAL_TABLET | ORAL | Status: DC

## 2021-11-04 MED ORDER — ACETAMINOPHEN 500 MG PO TABS
ORAL_TABLET | ORAL | Status: AC
  Filled 2021-11-04: qty 2

## 2021-11-04 MED ORDER — DEXAMETHASONE SODIUM PHOSPHATE 10 MG/ML IJ SOLN
INTRAMUSCULAR | Status: DC | PRN
  Administered 2021-11-04 (×2): 5 mg via INTRAVENOUS

## 2021-11-04 MED ORDER — MIDAZOLAM HCL 2 MG/2ML IJ SOLN
INTRAMUSCULAR | Status: AC
Start: 2021-11-04 — End: ?
  Filled 2021-11-04: qty 2

## 2021-11-04 MED ORDER — ROCURONIUM BROMIDE 10 MG/ML (PF) SYRINGE
PREFILLED_SYRINGE | INTRAVENOUS | Status: AC
  Filled 2021-11-04: qty 10

## 2021-11-04 MED ORDER — MIDAZOLAM HCL 2 MG/2ML IJ SOLN
INTRAMUSCULAR | Status: DC | PRN
  Administered 2021-11-04: 2 mg via INTRAVENOUS

## 2021-11-04 MED ORDER — LIDOCAINE 2% (20 MG/ML) 5 ML SYRINGE
INTRAMUSCULAR | Status: DC | PRN
  Administered 2021-11-04: 100 mg via INTRAVENOUS

## 2021-11-04 MED ORDER — ACETAMINOPHEN 500 MG PO TABS
1000.0000 mg | ORAL_TABLET | Freq: Once | ORAL | Status: AC
  Administered 2021-11-04: 1000 mg via ORAL

## 2021-11-04 MED ORDER — FENTANYL CITRATE (PF) 100 MCG/2ML IJ SOLN: 25.0000 ug | INTRAMUSCULAR | Status: DC | PRN

## 2021-11-04 MED ORDER — WHITE PETROLATUM EX OINT
TOPICAL_OINTMENT | CUTANEOUS | Status: AC
  Filled 2021-11-04: qty 5

## 2021-11-04 MED ORDER — OXYCODONE HCL 5 MG/5ML PO SOLN: 5.0000 mg | Freq: Once | ORAL | Status: AC | PRN

## 2021-11-04 MED ORDER — EPHEDRINE 5 MG/ML INJ
INTRAVENOUS | Status: AC
  Filled 2021-11-04: qty 5

## 2021-11-04 MED ORDER — PROPOFOL 10 MG/ML IV BOLUS
INTRAVENOUS | Status: DC | PRN
  Administered 2021-11-04: 200 mg via INTRAVENOUS

## 2021-11-04 MED ORDER — BUPIVACAINE HCL (PF) 0.25 % IJ SOLN
INTRAMUSCULAR | Status: DC | PRN
  Administered 2021-11-04: 10 mL

## 2021-11-04 MED ORDER — FENTANYL CITRATE (PF) 100 MCG/2ML IJ SOLN
INTRAMUSCULAR | Status: DC | PRN
  Administered 2021-11-04: 25 ug via INTRAVENOUS
  Administered 2021-11-04 (×2): 50 ug via INTRAVENOUS

## 2021-11-04 MED ORDER — FENTANYL CITRATE (PF) 100 MCG/2ML IJ SOLN
INTRAMUSCULAR | Status: AC
Start: 2021-11-04 — End: ?
  Filled 2021-11-04: qty 2

## 2021-11-04 MED ORDER — CELECOXIB 200 MG PO CAPS
200.0000 mg | ORAL_CAPSULE | Freq: Once | ORAL | Status: AC
  Administered 2021-11-04: 200 mg via ORAL

## 2021-11-04 MED ORDER — OXYCODONE HCL 5 MG PO TABS
5.0000 mg | ORAL_TABLET | Freq: Once | ORAL | Status: AC | PRN
  Administered 2021-11-04: 5 mg via ORAL

## 2021-11-04 MED ORDER — EPHEDRINE SULFATE (PRESSORS) 50 MG/ML IJ SOLN
INTRAMUSCULAR | Status: DC | PRN
  Administered 2021-11-04 (×2): 5 mg via INTRAVENOUS

## 2021-11-04 MED ORDER — OXYCODONE HCL 5 MG PO TABS: 5.0000 mg | ORAL_TABLET | ORAL | 0 refills | Status: DC | PRN

## 2021-11-04 SURGICAL SUPPLY — 33 items
ABLATOR SURESOUND NOVASURE (ABLATOR) ×3 IMPLANT
CATH ROBINSON RED A/P 16FR (CATHETERS) ×3 IMPLANT
DERMABOND ADVANCED (GAUZE/BANDAGES/DRESSINGS) ×1
DERMABOND ADVANCED .7 DNX12 (GAUZE/BANDAGES/DRESSINGS) ×2 IMPLANT
DILATOR CANAL MILEX (MISCELLANEOUS) IMPLANT
DRSG TELFA 3X8 NADH (GAUZE/BANDAGES/DRESSINGS) ×3 IMPLANT
DURAPREP 26ML APPLICATOR (WOUND CARE) ×3 IMPLANT
ELECT REM PT RETURN 9FT ADLT (ELECTROSURGICAL)
ELECTRODE REM PT RTRN 9FT ADLT (ELECTROSURGICAL) IMPLANT
GLOVE SURG ENC MOIS LTX SZ6 (GLOVE) ×3 IMPLANT
GOWN STRL REUS W/ TWL LRG LVL3 (GOWN DISPOSABLE) ×4 IMPLANT
GOWN STRL REUS W/TWL LRG LVL3 (GOWN DISPOSABLE) ×2
IRRIG SUCT STRYKERFLOW 2 WTIP (MISCELLANEOUS)
IRRIGATION SUCT STRKRFLW 2 WTP (MISCELLANEOUS) IMPLANT
KIT PROCEDURE FLUENT (KITS) ×3 IMPLANT
KIT TURNOVER CYSTO (KITS) ×3 IMPLANT
NEEDLE INSUFFLATION 120MM (ENDOMECHANICALS) ×3 IMPLANT
PACK LAPAROSCOPY BASIN (CUSTOM PROCEDURE TRAY) ×3 IMPLANT
PACK TRENDGUARD 450 HYBRID PRO (MISCELLANEOUS) ×2 IMPLANT
PACK VAGINAL MINOR WOMEN LF (CUSTOM PROCEDURE TRAY) ×3 IMPLANT
PAD DRESSING TELFA 3X8 NADH (GAUZE/BANDAGES/DRESSINGS) IMPLANT
PAD OB MATERNITY 4.3X12.25 (PERSONAL CARE ITEMS) ×3 IMPLANT
SEALER TISSUE G2 CVD JAW 35 (ENDOMECHANICALS) ×2 IMPLANT
SEALER TISSUE G2 CVD JAW 45CM (ENDOMECHANICALS) ×1
SUT VICRYL 4-0 PS2 18IN ABS (SUTURE) ×3 IMPLANT
TOWEL GREEN STERILE FF (TOWEL DISPOSABLE) ×6 IMPLANT
TOWEL OR 17X26 10 PK STRL BLUE (TOWEL DISPOSABLE) ×3 IMPLANT
TRENDGUARD 450 HYBRID PRO PACK (MISCELLANEOUS) ×3
TROCAR BLADELESS OPT 5 100 (ENDOMECHANICALS) ×6 IMPLANT
TROCAR KII 8X100ML NONTHREADED (TROCAR) IMPLANT
TROCAR XCEL NON BLADE 8MM B8LT (ENDOMECHANICALS) ×3 IMPLANT
TROCAR XCEL NON-BLD 5MMX100MML (ENDOMECHANICALS) ×3 IMPLANT
WARMER LAPAROSCOPE (MISCELLANEOUS) ×3 IMPLANT

## 2021-11-04 NOTE — Anesthesia Procedure Notes (Signed)
Procedure Name: Intubation ?Date/Time: 11/04/2021 7:36 AM ?Performed by: Suan Halter, CRNA ?Pre-anesthesia Checklist: Patient identified, Emergency Drugs available, Suction available and Patient being monitored ?Patient Re-evaluated:Patient Re-evaluated prior to induction ?Oxygen Delivery Method: Circle system utilized ?Preoxygenation: Pre-oxygenation with 100% oxygen ?Induction Type: IV induction ?Ventilation: Mask ventilation without difficulty ?Laryngoscope Size: Mac and 3 ?Tube type: Oral ?Tube size: 7.0 mm ?Number of attempts: 1 ?Airway Equipment and Method: Stylet and Oral airway ?Placement Confirmation: ETT inserted through vocal cords under direct vision, positive ETCO2 and breath sounds checked- equal and bilateral ?Secured at: 20 cm ?Tube secured with: Tape ?Dental Injury: Teeth and Oropharynx as per pre-operative assessment  ? ? ? ? ?

## 2021-11-04 NOTE — Transfer of Care (Signed)
Immediate Anesthesia Transfer of Care Note ? ?Patient: EVALYNE CORTOPASSI ? ?Procedure(s) Performed: Procedure(s) (LRB): ?DILATATION & CURETTAGE/HYSTEROSCOPY WITH NOVASURE ABLATION ?LAPAROSCOPIC BILATERAL SALPINGECTOMY (Bilateral) ? ?Patient Location: PACU ? ?Anesthesia Type: General ? ?Level of Consciousness: awake, oriented, sedated and patient cooperative ? ?Airway & Oxygen Therapy: Patient Spontanous Breathing and Patient connected to face mask oxygen ? ?Post-op Assessment: Report given to PACU RN and Post -op Vital signs reviewed and stable ? ?Post vital signs: Reviewed and stable ? ?Complications: No apparent anesthesia complications ? ?Last Vitals:  ?Vitals Value Taken Time  ?BP 118/71 11/04/21 0841  ?Temp    ?Pulse 82 11/04/21 0844  ?Resp 14 11/04/21 0844  ?SpO2 100 % 11/04/21 0844  ?Vitals shown include unvalidated device data. ? ?Last Pain:  ?Vitals:  ? 11/04/21 0602  ?TempSrc: Oral  ?PainSc: 0-No pain  ?   ? ?Patients Stated Pain Goal: 4 (11/04/21 0602) ? ?Complications: No notable events documented. ?

## 2021-11-04 NOTE — Discharge Instructions (Addendum)
?  Post Anesthesia Home Care Instructions ? ?Activity: ?Get plenty of rest for the remainder of the day. A responsible individual must stay with you for 24 hours following the procedure.  ?For the next 24 hours, DO NOT: ?-Drive a car ?-Paediatric nurse ?-Drink alcoholic beverages ?-Take any medication unless instructed by your physician ?-Make any legal decisions or sign important papers. ? ?Meals: ?Start with liquid foods such as gelatin or soup. Progress to regular foods as tolerated. Avoid greasy, spicy, heavy foods. If nausea and/or vomiting occur, drink only clear liquids until the nausea and/or vomiting subsides. Call your physician if vomiting continues. ? ?Special Instructions/Symptoms: ?Your throat may feel dry or sore from the anesthesia or the breathing tube placed in your throat during surgery. If this causes discomfort, gargle with warm salt water. The discomfort should disappear within 24 hours. ? ?DISCHARGE INSTRUCTIONS: HYSTEROSCOPY / ENDOMETRIAL ABLATION ?The following instructions have been prepared to help you care for yourself upon your return home. ? ?May take Tylenol after 12 PM. ? ?May take Ibuprofen after 2 PM. ? ?May take stool softner while taking narcotic pain medication to prevent constipation.  Drink plenty of water. ? ?Personal hygiene: ? Use sanitary pads for vaginal drainage, not tampons. ? Shower the day after your procedure. ? NO tub baths, pools or Jacuzzis for 2-3 weeks. ? Wipe front to back after using the bathroom. ? ?Activity and limitations: ? Do NOT drive or operate any equipment for 24 hours. The effects of anesthesia are still present ?and drowsiness may result. ? Do NOT rest in bed all day. ? Walking is encouraged. ? Walk up and down stairs slowly. ? You may resume your normal activity in one to two days or as indicated by your physician. ?Sexual activity: NO intercourse for at least 2 weeks after the procedure, or as indicated by your ?Doctor. ? ?Diet: Eat a light meal as  desired this evening. You may resume your usual diet tomorrow. ? ?Return to Work: You may resume your work activities in one to two days or as indicated by your ?Doctor. ? ?What to expect after your surgery: Expect to have vaginal bleeding/discharge for 2-3 days and ?spotting for up to 10 days. It is not unusual to have soreness for up to 1-2 weeks. You may have a ?slight burning sensation when you urinate for the first day. Mild cramps may continue for a couple of ?days. You may have a regular period in 2-6 weeks. ? ?Call your doctor for any of the following: ? Excessive vaginal bleeding or clotting, saturating and changing one pad every hour. ? Inability to urinate 6 hours after discharge from hospital. ? Pain not relieved by pain medication. ? Fever of 100.4? F or greater. ? Unusual vaginal discharge or odor. ?    ?

## 2021-11-04 NOTE — Anesthesia Postprocedure Evaluation (Signed)
Anesthesia Post Note ? ?Patient: MARELIN TAT ? ?Procedure(s) Performed: DILATATION & CURETTAGE/HYSTEROSCOPY WITH NOVASURE ABLATION (Uterus) ?LAPAROSCOPIC BILATERAL SALPINGECTOMY (Bilateral: Abdomen) ? ?  ? ?Patient location during evaluation: PACU ?Anesthesia Type: General ?Level of consciousness: awake and alert ?Pain management: pain level controlled ?Vital Signs Assessment: post-procedure vital signs reviewed and stable ?Respiratory status: spontaneous breathing, nonlabored ventilation and respiratory function stable ?Cardiovascular status: stable and blood pressure returned to baseline ?Anesthetic complications: no ? ? ?No notable events documented. ? ?Last Vitals:  ?Vitals:  ? 11/04/21 0915 11/04/21 0941  ?BP: 116/76 130/85  ?Pulse: 73 73  ?Resp: 16 16  ?Temp:  36.7 ?C  ?SpO2: 100% 100%  ?  ?Last Pain:  ?Vitals:  ? 11/04/21 0900  ?TempSrc:   ?PainSc: 4   ? ? ?  ?  ?  ?  ?  ?  ? ?Beryle Lathe ? ? ? ? ?

## 2021-11-04 NOTE — Interval H&P Note (Signed)
History and Physical Interval Note: ? ?11/04/2021 ?7:04 AM ? ?Alexandra Trujillo  has presented today for surgery, with the diagnosis of AUB ?Undesired Fertility.  The various methods of treatment have been discussed with the patient and family. After consideration of risks, benefits and other options for treatment, the patient has consented to  Procedure(s): ?DILATATION & CURETTAGE/HYSTEROSCOPY WITH NOVASURE ABLATION (N/A) ?LAPAROSCOPIC BILATERAL SALPINGECTOMY (Bilateral) as a surgical intervention.  The patient's history has been reviewed, patient examined, no change in status, stable for surgery.  I have reviewed the patient's chart and labs.  Questions were answered to the patient's satisfaction.   ? ? ?Milas Hock ? ? ?

## 2021-11-04 NOTE — Op Note (Signed)
Rae Roam Criscione ?PROCEDURE DATE: 11/04/2021 ? ? ?PREOPERATIVE DIAGNOSIS:  Heavy vaginal bleeding, Undesired fertility ? ?POSTOPERATIVE DIAGNOSIS:  Heavy vaginal bleeding, Undesired fertility ? ?PROCEDURE:  D&C, Hysteroscopy, Novasure Endometrial Ablation, Laparoscopic Bilateral Salpingectomy  ? ?SURGEON:  Dr. Milas Hock ? ?ASSISTANT:  None ? ?ANESTHESIA:  General endotracheal, 10 cc of 0.25% marcaine local in the laparoscopic incisions following the procedure ? ?COMPLICATIONS:  None immediate. ? ?ESTIMATED BLOOD LOSS:  10 ml. ? ?FLUIDS: 800 ml LR. ? ?URINE OUTPUT:  50 ml of clear urine. ? ?INDICATIONS: 31 y.o. [id number] with undesired fertility, desires permanent sterilization.  Other forms of contraception were discussed with patient and emphasized alternatives of vasectomy, IUDs and Nexplanon as they have equivalent contraceptive efficacy; she declines all other modalities.  Risks of procedure discussed with patient including permanence of method, risk of regret, bleeding, infection, injury to surrounding organs and need for additional procedures including laparotomy.  Failure risk less than 0.5% with increased risk of ectopic gestation if pregnancy occurs was also discussed with patient.  Written informed consent was obtained. ?   ?FINDINGS:  Normal EFG, vagina, cervix, uterus, fallopian tubes, and ovaries. Normal endometrial cavity with ostia visualized bilaterally. Following surgery there was homogenous char following the endometrial ablation.  ? ?TECHNIQUE:  The patient was taken to the operating room where general anesthesia was obtained without difficulty.  She was then placed in the dorsal lithotomy position and prepared and draped in sterile fashion.  After an adequate timeout was performed, a bivalved speculum was then placed in the patient's vagina, and the anterior lip of cervix grasped with the single-tooth tenaculum.   ? ?Uterus sounded to 9 cm and the cervix was measured to 4 cm giving a cavity  length of 5 cm.  Cervix progressively dilated to a 19 Pratt. 30 degree hysteroscope was inserted into the cavity with the aforementioned findings noted. ECC and EMC performed. The Novasure was inserted into the cavity with the aforementioned settings and the cavity width was measured to 3.9. The power ws then 107. The cavity check was performed and normal/intact. She was then approved for ablation and it was enacted for a total of 1:21. After the procedure the hysteroscope was reinserted into the cavity and the char was noted diffusely throughout the cavity.  ? ?All instruments were removed from the vagina after assuring tenaculum sites were hemostatic. Bladder was drained with red rubber catheter at this time.  ? ?She was then taken out of lithotomy position into dorsal supine position. She was then prepped again and draped. Another timeout was performed.  ? ? A skin incision was made with the 11 blade scalpel in the umbilicus. I entered her abdominal cavity with a kelly through the natural defect in her umbilicus. An S retractor was inserted and the trocar was inserted into the abdominal cavity. Opening pressure was 5.  Pneumoperitoneum achieved to a pressure of 15.  Below the point of entry and the pelvis was inspected with no evidence of injury.  ? ?The scalpel was then used to make two 72mm incsions, one in the LLQ and one suprapubically. A 2mm port was inserted in the left and 8 mm port was inserted in the suprapubic area. The fallopian tubes were cauterized, cut, and detached from their surrounding pelvic structures with the enseal. No bleeding was noted. The specimens were withdrawn through the 22mm ports. The pelvis was inspected and was hemostatic. All ports were then withdrawn and the gas drained from abdomen. They were  then closed in a subcuticular fashion with 4-0 vicryl and dermabond was placed over the incisions. ? ?The patient will be discharged to home as per PACU criteria.  Routine postoperative  instructions given.  She will follow up in the office in 4 wks for postoperative evaluation. ? ? ?Milas Hock, MD ?Attending Obstetrician & Gynecologist, Faculty Practice ?Center for [employer], [facility] ? ? ?

## 2021-11-05 ENCOUNTER — Encounter (HOSPITAL_BASED_OUTPATIENT_CLINIC_OR_DEPARTMENT_OTHER): Payer: Self-pay | Admitting: Obstetrics and Gynecology

## 2021-11-06 LAB — SURGICAL PATHOLOGY

## 2021-11-12 ENCOUNTER — Encounter: Payer: Self-pay | Admitting: Obstetrics and Gynecology

## 2021-12-31 ENCOUNTER — Ambulatory Visit
Admission: RE | Admit: 2021-12-31 | Discharge: 2021-12-31 | Disposition: A | Discharge status: Home / Self Care | Payer: BC Managed Care – PPO | Source: Ambulatory Visit | Attending: Emergency Medicine | Admitting: Emergency Medicine

## 2021-12-31 DIAGNOSIS — Z20818 Contact with and (suspected) exposure to other bacterial communicable diseases: Secondary | ICD-10-CM | POA: Diagnosis not present

## 2021-12-31 DIAGNOSIS — J039 Acute tonsillitis, unspecified: Secondary | ICD-10-CM

## 2021-12-31 LAB — POCT RAPID STREP A (OFFICE): Rapid Strep A Screen: NEGATIVE

## 2021-12-31 MED ORDER — CEFDINIR 300 MG PO CAPS: 300.0000 mg | ORAL_CAPSULE | Freq: Two times a day (BID) | ORAL | 0 refills | Status: AC

## 2021-12-31 NOTE — Discharge Instructions (Signed)
The results of your rapid strep test today are negative, throat culture will be performed per protocol to test for the presence of strep in the sample obtained from your throat.  This typically takes 3 to 5 days.  Because of your known exposure to your children, in my personal opinion it is inevitable that you will catch strep throat from them.  At this time however my physical exam findings are not concerning for acute infection.  Guidelines do not recommend prophylactic treatment for strep throat, this is a consideration of antibiotic stewardship and helping reduce overuse of antibiotics when they are not needed.  As we discussed, I recommend that you monitor your symptoms for the next 24 to 48 hours.  If you believe that your discomfort in your throat improves, I do not believe antibiotics will be needed.    If you believe that the discomfort in your throat is getting worse, I encourage you to go to your pharmacy to pick up a prescription for cefdinir that I have sent today.  Cefdinir should be taken for a full 10 days, 1 capsule twice daily.  Is important that you complete all of that as prescribed to avoid risk of worsening infection with any remaining, resistant bacteria which will be much more difficult to treat.  Certainly, if your strep culture is positive, I absolutely recommend that she begin taking cefdinir as prescribed.  You are welcome to try ibuprofen, Chloraseptic throat spray and gargling 4 ounces of warm water with a teaspoon of salt mixed in for your symptoms.  Hot tea with honey is also nice.  Thank you for visiting urgent care today.  Please let us know if there is anything else we can do for you.

## 2021-12-31 NOTE — ED Provider Notes (Signed)
[facility]    CSN: [id number] Arrival date & time: 12/31/21  1050    HISTORY   Chief Complaint  Patient presents with   Sore Throat   Headache   HPI Alexandra Trujillo is a 31 y.o. female. Pt presents to urgent care complaining of sore throat and headache x 2 days.  States both of her children tested positive for strep recently.  Patient states there has felt more scratchy than sore, is not painful to swallow, not having difficulty managing secretions or maintaining airway.  Patient states she is had some mild body ache but denies fever.  Patient states she had a mild cough and feels that the cough is related to her scratchy throat.  Patient states has not noticed any significant increase in nasal congestion but does report history of allergies.  The history is provided by the patient.  Past Medical History:  Diagnosis Date   Anxiety    Arthritis    hands   Asthma, mild    Chronic rhinitis    Environmental allergies    Heavy menstrual bleeding    History of cervical dysplasia    History of COVID-19    per pt has covid x3 first time early 2020 severy symptoms 3 months to resolved;  second time 2021 w/ moderate to severe , resolved in 2 wk;  third time 03/ 2022 mild symptoms that resolved   Hx of maternal laceration, 4th degree, currently pregnant 02/11/2014   1st pregnancy, no laceration on 3rd baby    Menstrual migraine    PMDD (premenstrual dysphoric disorder)    Wears contact lenses    Patient Active Problem List   Diagnosis Date Noted   Unwanted fertility    Dysplasia of cervix, low grade (CIN 1) 09/17/2021   Menorrhagia 09/17/2021   Obesity, Class I, BMI 30-34.9 09/09/2021   Allergic reaction 03/06/2021   Abscess 01/15/2021   Skin infection 01/12/2021   Sinusitis 12/16/2020   Rib pain 12/16/2020   BMI 32.0-32.9,adult 12/01/2020   Anxiety 12/01/2020   Environmental allergies 04/26/2018   Aphthous ulcer of mouth 04/26/2018   Perennial allergic  rhinitis 04/26/2018   Menstrual migraine without status migrainosus, not intractable 04/26/2018   Rubella non-immune status, antepartum 02/11/2014   Past Surgical History:  Procedure Laterality Date   DENTAL RESTORATION/EXTRACTION WITH X-RAY     age 8   DILITATION & CURRETTAGE/HYSTROSCOPY WITH NOVASURE ABLATION  11/04/2021   Procedure: DILATATION & CURETTAGE/HYSTEROSCOPY WITH NOVASURE ABLATION;  Surgeon: Milas Hock, MD;  Location: [facility];  Service: Gynecology;;   LAPAROSCOPIC BILATERAL SALPINGECTOMY Bilateral 11/04/2021   Procedure: LAPAROSCOPIC BILATERAL SALPINGECTOMY;  Surgeon: Milas Hock, MD;  Location: [facility];  Service: Gynecology;  Laterality: Bilateral;   LAPAROSCOPY  2012   retrival IUD from pelvic area   OB History     Gravida  4   Para  4   Term  4   Preterm      AB      Living  4      SAB      IAB      Ectopic      Multiple  0   Live Births  4          Home Medications    Prior to Admission medications   Medication Sig Start Date End Date Taking? Authorizing Provider  albuterol (VENTOLIN HFA) 108 (90 Base) MCG/ACT inhaler Inhale 2 puffs into the lungs every 6 (six)  hours as needed for wheezing or shortness of breath. 07/28/21   Bing Neighbors, FNP  escitalopram (LEXAPRO) 20 MG tablet TAKE 1 TABLET(20 MG) BY MOUTH DAILY Patient taking differently: Take 20 mg by mouth at bedtime. 06/15/21   de [place], Buren Kos, MD  ibuprofen (ADVIL) 800 MG tablet Take 1 tablet (800 mg total) by mouth 3 (three) times daily with meals as needed for headache, moderate pain or cramping. 11/04/21   Milas Hock, MD  levocetirizine (XYZAL) 5 MG tablet Take 5 mg by mouth every evening.    [provider]  lidocaine (XYLOCAINE) 2 % solution Use as directed 15 mLs in the mouth or throat as needed for mouth pain. Apply to canker sores in mouth using cotton tipped applicator as needed. 11/04/20   Waldon Merl, PA-C   liraglutide (VICTOZA) 18 MG/3ML SOPN Inject 1.8 mg into the skin daily. Patient not taking: Reported on 11/02/2021 09/21/21   de [place], Buren Kos, MD  ondansetron (ZOFRAN-ODT) 4 MG disintegrating tablet Take 1 tablet (4 mg total) by mouth every 8 (eight) hours as needed for nausea or vomiting. 09/24/21   Waldon Merl, PA-C  oxyCODONE (OXY IR/ROXICODONE) 5 MG immediate release tablet Take 1 tablet (5 mg total) by mouth every 4 (four) hours as needed for severe pain or breakthrough pain. 11/04/21   Milas Hock, MD  rizatriptan (MAXALT-MLT) 5 MG disintegrating tablet Take 1 tablet (5 mg total) by mouth as needed for migraine. May repeat in 2 hours if needed. Needs appointment. 07/22/21   de [place], Raymond J, MD   Family History Family History  Problem Relation Age of Onset   Thyroid disease Maternal Aunt    Stroke Maternal Grandmother    Hypertension Maternal Grandmother    Asthma Mother    Healthy Father    Social History Social History   Tobacco Use   Smoking status: Never   Smokeless tobacco: Never  Vaping Use   Vaping Use: Never used  Substance Use Topics   Alcohol use: Not Currently   Drug use: Never   Allergies   Tree extract, Peanut-containing drug products, and Clavulanic acid  Review of Systems Review of Systems Pertinent findings noted in history of present illness.   Physical Exam Triage Vital Signs ED Triage Vitals  Enc Vitals Group     BP 06/05/21 0827 (!) 147/82     Pulse Rate 06/05/21 0827 72     Resp 06/05/21 0827 18     Temp 06/05/21 0827 98.3 F (36.8 C)     Temp Source 06/05/21 0827 Oral     SpO2 06/05/21 0827 98 %     Weight --      Height --      Head Circumference --      Peak Flow --      Pain Score 06/05/21 0826 5     Pain Loc --      Pain Edu? --      Excl. in GC? --   No data found.  Updated Vital Signs There were no vitals taken for this visit.  Physical Exam Vitals and nursing note reviewed.  Constitutional:      General: She is  not in acute distress.    Appearance: Normal appearance. She is not ill-appearing.  HENT:     Head: Normocephalic and atraumatic.     Salivary Glands: Right salivary gland is not diffusely enlarged or tender. Left salivary gland is not diffusely enlarged or tender.  Right Ear: Tympanic membrane, ear canal and external ear normal. No drainage. No middle ear effusion. There is no impacted cerumen. Tympanic membrane is not erythematous or bulging.     Left Ear: Tympanic membrane, ear canal and external ear normal. No drainage.  No middle ear effusion. There is no impacted cerumen. Tympanic membrane is not erythematous or bulging.     Nose: Nose normal. No nasal deformity, septal deviation, mucosal edema, congestion or rhinorrhea.     Right Turbinates: Not enlarged, swollen or pale.     Left Turbinates: Not enlarged, swollen or pale.     Right Sinus: No maxillary sinus tenderness or frontal sinus tenderness.     Left Sinus: No maxillary sinus tenderness or frontal sinus tenderness.     Mouth/Throat:     Lips: Pink. No lesions.     Mouth: Mucous membranes are moist. No oral lesions.     Pharynx: Oropharynx is clear. Uvula midline. No posterior oropharyngeal erythema or uvula swelling.     Tonsils: No tonsillar exudate. 0 on the right. 0 on the left.  Eyes:     General: Lids are normal.        Right eye: No discharge.        Left eye: No discharge.     Extraocular Movements: Extraocular movements intact.     Conjunctiva/sclera: Conjunctivae normal.     Right eye: Right conjunctiva is not injected.     Left eye: Left conjunctiva is not injected.  Neck:     Trachea: Trachea and phonation normal.  Cardiovascular:     Rate and Rhythm: Normal rate and regular rhythm.     Pulses: Normal pulses.     Heart sounds: Normal heart sounds. No murmur heard.   No friction rub. No gallop.  Pulmonary:     Effort: Pulmonary effort is normal. No accessory muscle usage, prolonged expiration or respiratory  distress.     Breath sounds: Normal breath sounds. No stridor, decreased air movement or transmitted upper airway sounds. No decreased breath sounds, wheezing, rhonchi or rales.  Chest:     Chest wall: No tenderness.  Musculoskeletal:        General: Normal range of motion.     Cervical back: Normal range of motion and neck supple. Normal range of motion.  Lymphadenopathy:     Cervical: No cervical adenopathy.  Skin:    General: Skin is warm and dry.     Findings: No erythema or rash.  Neurological:     General: No focal deficit present.     Mental Status: She is alert and oriented to person, place, and time.  Psychiatric:        Mood and Affect: Mood normal.        Behavior: Behavior normal.    Visual Acuity Right Eye Distance:   Left Eye Distance:   Bilateral Distance:    Right Eye Near:   Left Eye Near:    Bilateral Near:     UC Couse / Diagnostics / Procedures:    EKG  Radiology No results found.  Procedures Procedures (including critical care time)  UC Diagnoses / Final Clinical Impressions(s)   I have reviewed the triage vital signs and the nursing notes.  Pertinent labs & imaging results that were available during my care of the patient were reviewed by me and considered in my medical decision making (see chart for details).   Final diagnoses:  Exposure to Streptococcal pharyngitis  Acute tonsillitis, unspecified etiology  Physical exam is unremarkable for acute bacterial infection at this time.  Patient advised that rapid strep test is negative and throat culture is being performed, will take 3 to 5 days to complete and we will notify her of the results once received.  Because patient is currently living with 2 strep positive children, advised her that is likely that she will develop a strep throat infection and that she should continue to monitor her symptoms over the next 24 to 48 hours.  Patient advised that if her throat sore throat worsens, she should pick  up the prescription for cefdinir and begin taking it and to complete all doses as prescribed.  Patient advised that if her throat begins to feel better, it is likely either a virus or worsening allergies that is causing her throat discomfort and that she should not take the antibiotics and instead treat with allergy medications.  Patient verbalized understanding and agreed with plan as outlined.  Return precautions advised.  ED Prescriptions     Medication Sig Dispense Auth. Provider   cefdinir (OMNICEF) 300 MG capsule Take 1 capsule (300 mg total) by mouth 2 (two) times daily for 10 days. 20 capsule Theadora Rama Scales, PA-C      PDMP not reviewed this encounter.  Pending results:  Labs Reviewed  POCT RAPID STREP A (OFFICE) - Normal  CULTURE, GROUP A STREP [facility])    Medications Ordered in UC: Medications - No data to display  Disposition Upon Discharge:  Condition: stable for discharge home Home: take medications as prescribed; routine discharge instructions as discussed; follow up as advised.  Patient presented with an acute illness with associated systemic symptoms and significant discomfort requiring urgent management. In my opinion, this is a condition that a prudent lay person (someone who possesses an average knowledge of health and medicine) may potentially expect to result in complications if not addressed urgently such as respiratory distress, impairment of bodily function or dysfunction of bodily organs.   Routine symptom specific, illness specific and/or disease specific instructions were discussed with the patient and/or caregiver at length.   As such, the patient has been evaluated and assessed, work-up was performed and treatment was provided in alignment with urgent care protocols and evidence based medicine.  Patient/parent/caregiver has been advised that the patient may require follow up for further testing and treatment if the symptoms continue in spite of treatment,  as clinically indicated and appropriate.  If the patient was tested for COVID-19, Influenza and/or RSV, then the patient/parent/guardian was advised to isolate at home pending the results of his/her diagnostic coronavirus test and potentially longer if they're positive. I have also advised pt that if his/her COVID-19 test returns positive, it's recommended to self-isolate for at least 10 days after symptoms first appeared AND until fever-free for 24 hours without fever reducer AND other symptoms have improved or resolved. Discussed self-isolation recommendations as well as instructions for household member/close contacts as per the [facility] and [place] [facility], and also gave patient the COVID packet with this information.  Patient/parent/caregiver has been advised to return to the [facility] or PCP in 3-5 days if no better; to PCP or the Emergency Department if new signs and symptoms develop, or if the current signs or symptoms continue to change or worsen for further workup, evaluation and treatment as clinically indicated and appropriate  The patient will follow up with their current PCP if and as advised. If the patient does not currently have a PCP we will assist them  in obtaining one.   The patient may need specialty follow up if the symptoms continue, in spite of conservative treatment and management, for further workup, evaluation, consultation and treatment as clinically indicated and appropriate.  Patient/parent/caregiver verbalized understanding and agreement of plan as discussed.  All questions were addressed during visit.  Please see discharge instructions below for further details of plan.  Discharge Instructions:   Discharge Instructions      The results of your rapid strep test today are negative, throat culture will be performed per protocol to test for the presence of strep in the sample obtained from your throat.  This typically takes 3 to 5 days.  Because of your known exposure to your children,  in my personal opinion it is inevitable that you will catch strep throat from them.  At this time however my physical exam findings are not concerning for acute infection.  Guidelines do not recommend prophylactic treatment for strep throat, this is a consideration of antibiotic stewardship and helping reduce overuse of antibiotics when they are not needed.  As we discussed, I recommend that you monitor your symptoms for the next 24 to 48 hours.  If you believe that your discomfort in your throat improves, I do not believe antibiotics will be needed.    If you believe that the discomfort in your throat is getting worse, I encourage you to go to your pharmacy to pick up a prescription for cefdinir that I have sent today.  Cefdinir should be taken for a full 10 days, 1 capsule twice daily.  Is important that you complete all of that as prescribed to avoid risk of worsening infection with any remaining, resistant bacteria which will be much more difficult to treat.  Certainly, if your strep culture is positive, I absolutely recommend that she begin taking cefdinir as prescribed.  You are welcome to try ibuprofen, Chloraseptic throat spray and gargling 4 ounces of warm water with a teaspoon of salt mixed in for your symptoms.  Hot tea with honey is also nice.  Thank you for visiting urgent care today.  Please let us know if there is anything else we can do for you.      This office note has been dictated using Teaching laboratory technician.  Unfortunately, and despite my best efforts, this method of dictation can sometimes lead to occasional typographical or grammatical errors.  I apologize in advance if this occurs.     Theadora Rama Scales, PA-C 12/31/21 1422

## 2021-12-31 NOTE — ED Triage Notes (Signed)
Pt here with sore throat and headache x 2 days. Both children are positive for strep

## 2022-01-02 LAB — CULTURE, GROUP A STREP (THRC)

## 2022-01-14 ENCOUNTER — Other Ambulatory Visit (HOSPITAL_BASED_OUTPATIENT_CLINIC_OR_DEPARTMENT_OTHER): Payer: Self-pay | Admitting: Family Medicine

## 2022-01-14 DIAGNOSIS — F419 Anxiety disorder, unspecified: Secondary | ICD-10-CM

## 2022-03-01 ENCOUNTER — Ambulatory Visit: Payer: BC Managed Care – PPO

## 2022-04-10 ENCOUNTER — Other Ambulatory Visit (HOSPITAL_BASED_OUTPATIENT_CLINIC_OR_DEPARTMENT_OTHER): Payer: Self-pay | Admitting: Family Medicine

## 2022-04-10 DIAGNOSIS — J3089 Other allergic rhinitis: Secondary | ICD-10-CM

## 2022-04-14 IMAGING — DX DG ABDOMEN ACUTE W/ 1V CHEST
4 series · 4 of 4 positions shown · non-contrast
Comparison: None.

CLINICAL DATA: Left upper quadrant pain.

EXAM:
DG ABDOMEN ACUTE WITH 1 VIEW CHEST

[abdomen erect]
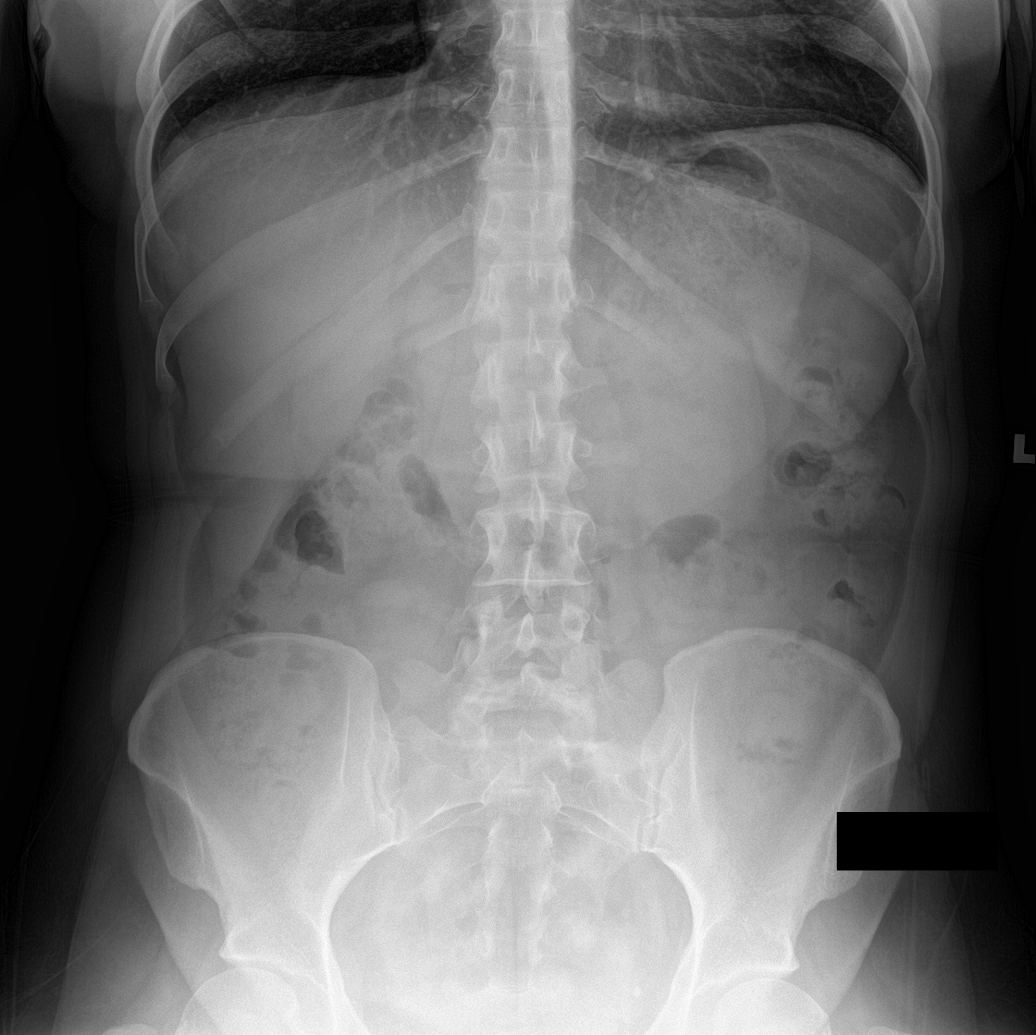

[abdomen supine (1 of 2)]
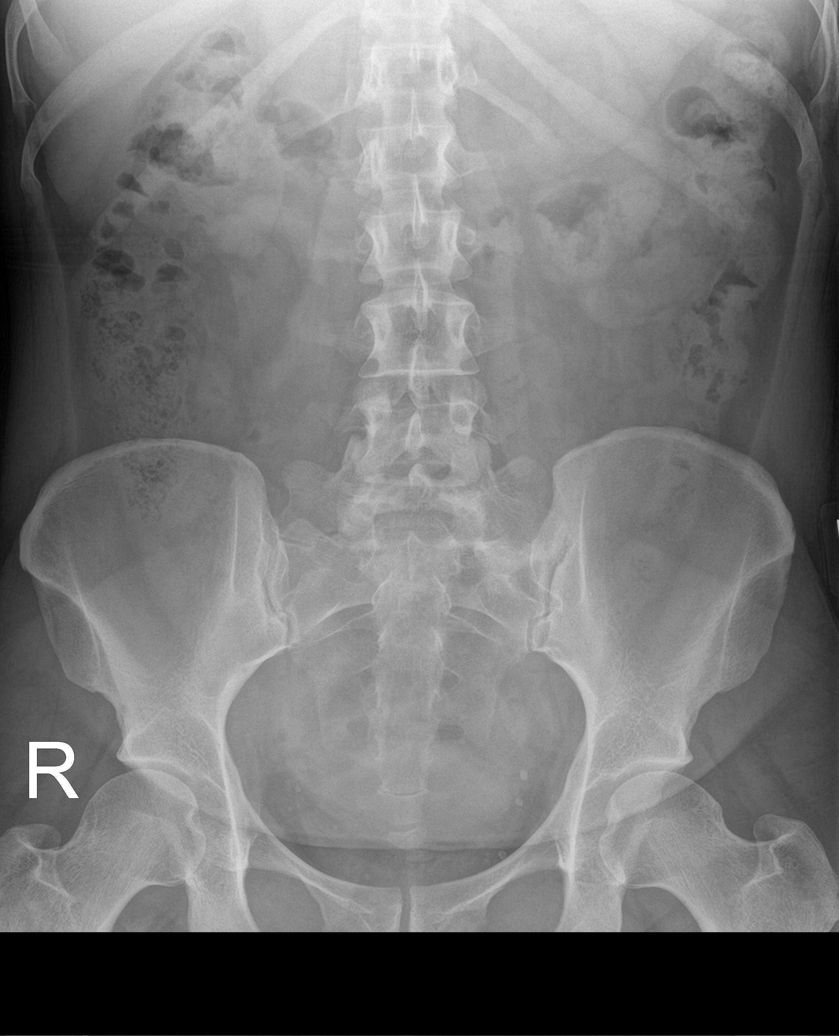

[chest pa]
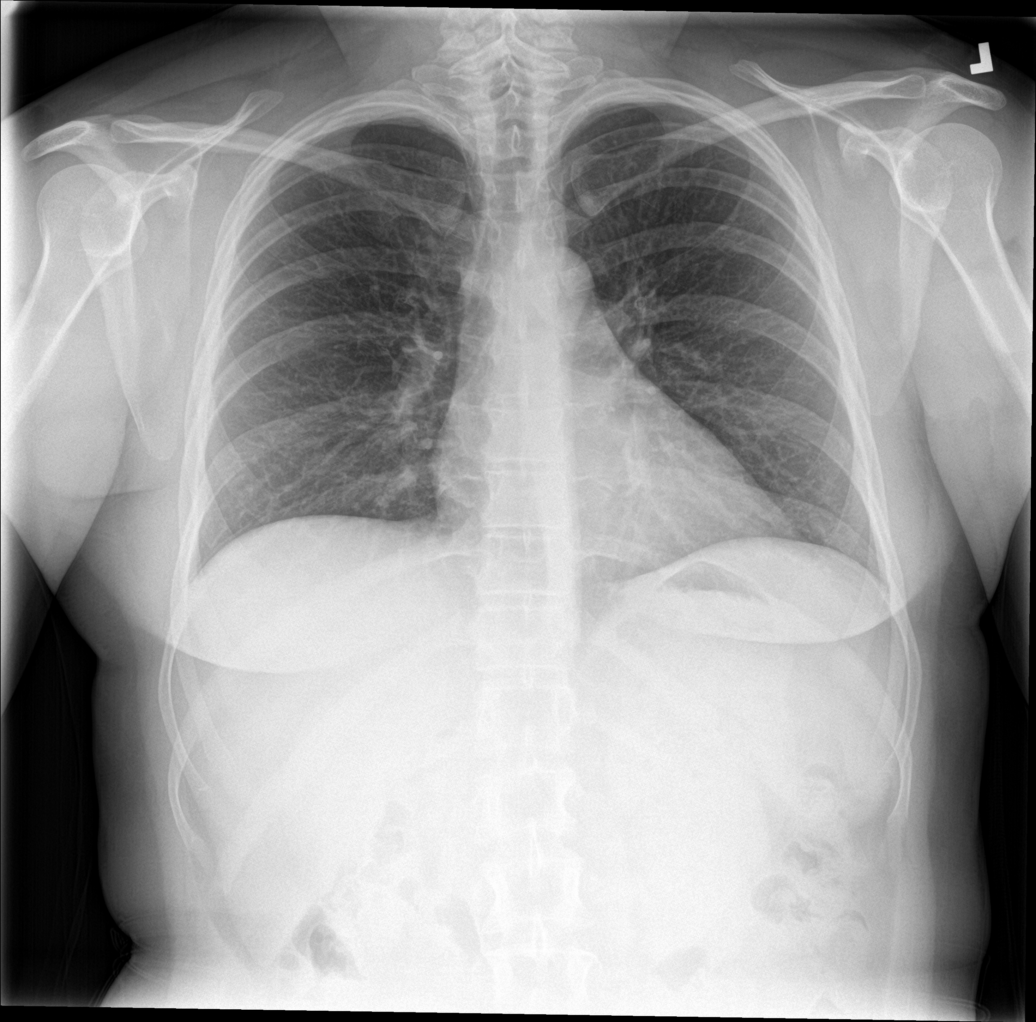

[abdomen supine (2 of 2)]
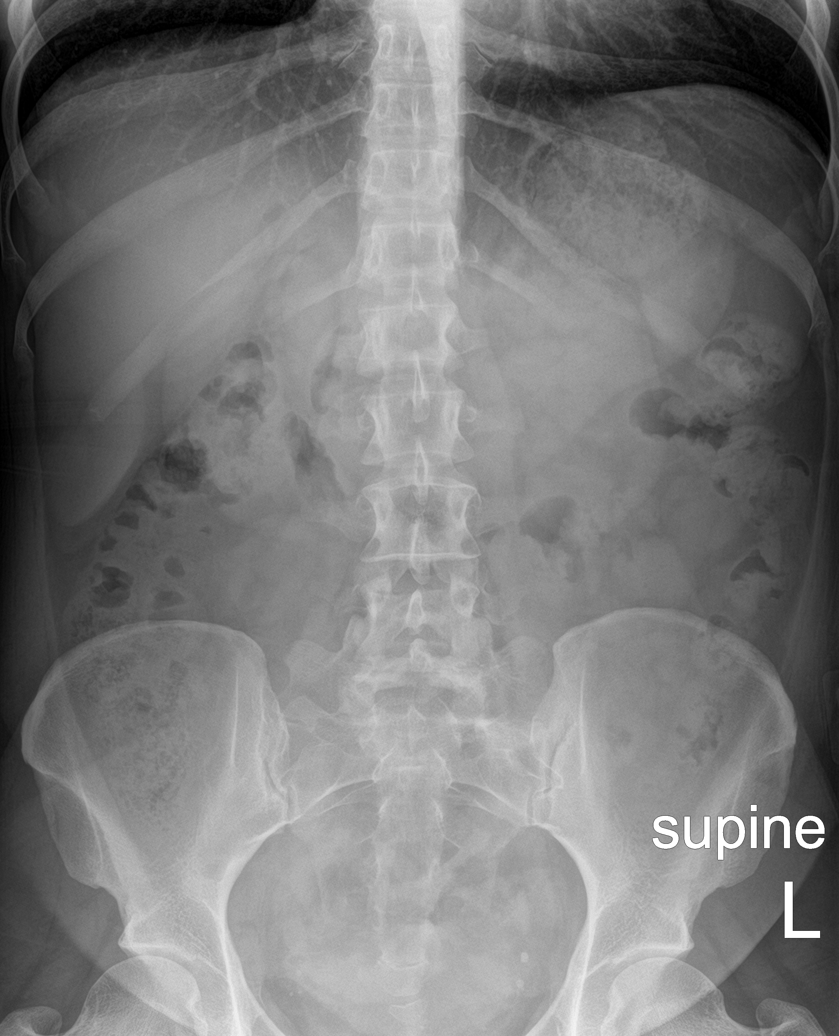

[4 of 4 positions shown; findings below may reference images not displayed]

FINDINGS: There is no evidence of dilated bowel loops or free intraperitoneal
air. No radiopaque calculi overlying the urinary tract are
identified. Phleboliths are seen in the pelvis. Heart size and
mediastinal contours are within normal limits. Both lungs are clear.
IMPRESSION: Negative abdominal radiographs.  No acute cardiopulmonary disease.

## 2022-06-01 ENCOUNTER — Telehealth: Payer: Self-pay | Admitting: Physician Assistant

## 2022-06-01 DIAGNOSIS — J208 Acute bronchitis due to other specified organisms: Secondary | ICD-10-CM

## 2022-06-01 MED ORDER — BENZONATATE 100 MG PO CAPS: 100.0000 mg | ORAL_CAPSULE | Freq: Three times a day (TID) | ORAL | 0 refills | Status: DC | PRN

## 2022-06-01 MED ORDER — PREDNISONE 20 MG PO TABS: 40.0000 mg | ORAL_TABLET | Freq: Every day | ORAL | 0 refills | Status: DC

## 2022-06-01 NOTE — Progress Notes (Signed)
I have spent 5 minutes in review of e-visit questionnaire, review and updating patient chart, medical decision making and response to patient.   Tevan Marian Cody Faraaz Wolin, PA-C    

## 2022-06-01 NOTE — Progress Notes (Signed)
We are sorry that you are not feeling well.  Here is how we plan to help!  Based on your presentation I believe you most likely have A cough due to a virus.  This is called viral bronchitis and is best treated by rest, plenty of fluids and control of the cough.  You may use Ibuprofen or Tylenol as directed to help your symptoms.     In addition you may use A prescription cough medication called Tessalon Perles 100mg . You may take 1-2 capsules every 8 hours as needed for your cough.  I have also prescribed a short course of steroid to take as directed.   From your responses in the eVisit questionnaire you describe inflammation in the upper respiratory tract which is causing a significant cough.  This is commonly called Bronchitis and has four common causes:   Allergies Viral Infections Acid Reflux Bacterial Infection Allergies, viruses and acid reflux are treated by controlling symptoms or eliminating the cause. An example might be a cough caused by taking certain blood pressure medications. You stop the cough by changing the medication. Another example might be a cough caused by acid reflux. Controlling the reflux helps control the cough.  USE OF BRONCHODILATOR ("RESCUE") INHALERS: There is a risk from using your bronchodilator too frequently.  The risk is that over-reliance on a medication which only relaxes the muscles surrounding the breathing tubes can reduce the effectiveness of medications prescribed to reduce swelling and congestion of the tubes themselves.  Although you feel brief relief from the bronchodilator inhaler, your asthma may actually be worsening with the tubes becoming more swollen and filled with mucus.  This can delay other crucial treatments, such as oral steroid medications. If you need to use a bronchodilator inhaler daily, several times per day, you should discuss this with your provider.  There are probably better treatments that could be used to keep your asthma under  control.     HOME CARE Only take medications as instructed by your medical team. Complete the entire course of an antibiotic. Drink plenty of fluids and get plenty of rest. Avoid close contacts especially the very young and the elderly Cover your mouth if you cough or cough into your sleeve. Always remember to wash your hands A steam or ultrasonic humidifier can help congestion.   GET HELP RIGHT AWAY IF: You develop worsening fever. You become short of breath You cough up blood. Your symptoms persist after you have completed your treatment plan MAKE SURE YOU  Understand these instructions. Will watch your condition. Will get help right away if you are not doing well or get worse.    Thank you for choosing an e-visit.  Your e-visit answers were reviewed by a board certified advanced clinical practitioner to complete your personal care plan. Depending upon the condition, your plan could have included both over the counter or prescription medications.  Please review your pharmacy choice. Make sure the pharmacy is open so you can pick up prescription now. If there is a problem, you may contact your provider through [employer] and have the prescription routed to another pharmacy.  Your safety is important to [place]. If you have drug allergies check your prescription carefully.   For the next 24 hours you can use MyChart to ask questions about today's visit, request a non-urgent call back, or ask for a work or school excuse. You will get an email in the next two days asking about your experience. I hope that your  e-visit has been valuable and will speed your recovery.

## 2022-07-09 DIAGNOSIS — M542 Cervicalgia: Secondary | ICD-10-CM | POA: Diagnosis not present

## 2022-07-23 ENCOUNTER — Ambulatory Visit
Admission: EM | Admit: 2022-07-23 | Discharge: 2022-07-23 | Disposition: A | Discharge status: Home / Self Care | Payer: BC Managed Care – PPO | Attending: Family Medicine | Admitting: Family Medicine

## 2022-07-23 ENCOUNTER — Telehealth: Payer: Self-pay | Admitting: Family Medicine

## 2022-07-23 ENCOUNTER — Encounter: Payer: Self-pay | Admitting: Emergency Medicine

## 2022-07-23 DIAGNOSIS — J111 Influenza due to unidentified influenza virus with other respiratory manifestations: Secondary | ICD-10-CM | POA: Diagnosis not present

## 2022-07-23 DIAGNOSIS — Z1152 Encounter for screening for COVID-19: Secondary | ICD-10-CM | POA: Insufficient documentation

## 2022-07-23 DIAGNOSIS — J101 Influenza due to other identified influenza virus with other respiratory manifestations: Secondary | ICD-10-CM | POA: Insufficient documentation

## 2022-07-23 DIAGNOSIS — R059 Cough, unspecified: Secondary | ICD-10-CM

## 2022-07-23 LAB — RESP PANEL BY RT-PCR (FLU A&B, COVID) ARPGX2
Influenza A by PCR: POSITIVE — AB
Influenza B by PCR: NEGATIVE
SARS Coronavirus 2 by RT PCR: NEGATIVE

## 2022-07-23 MED ORDER — BENZONATATE 200 MG PO CAPS: 200.0000 mg | ORAL_CAPSULE | Freq: Three times a day (TID) | ORAL | 0 refills | Status: AC | PRN

## 2022-07-23 MED ORDER — PROMETHAZINE-DM 6.25-15 MG/5ML PO SYRP: 5.0000 mL | ORAL_SOLUTION | Freq: Two times a day (BID) | ORAL | 0 refills | Status: DC | PRN

## 2022-07-23 MED ORDER — OSELTAMIVIR PHOSPHATE 75 MG PO CAPS: 75.0000 mg | ORAL_CAPSULE | Freq: Two times a day (BID) | ORAL | 0 refills | Status: DC

## 2022-07-23 NOTE — Discharge Instructions (Addendum)
Instructed patient to take medication as directed with food to completion.  Advised patient to take prednisone with first dose of Tamiflu for the next 5 days.  Advised may use Tessalon daily as needed for cough.  Advised patient may use Promethazine DM at night for cough due to sedative effects.  Advised not to use cough medications together.  Encouraged patient to increase daily water intake to 64 ounces per day while taking these medications.  Advised we will follow-up with COVID-19/influenza results once received.

## 2022-07-23 NOTE — ED Triage Notes (Signed)
Productive cough w/ congestion since Tuesday  Hx of asthma - no inhaler  Son is positive for strep & flu  Had leftover  steroids - took the last 3 days (BID), last dose this am  OTC  dayquil, Mucinex cold  Chest burns when she breathes Tmax 102 yesterday  Audible wheezes

## 2022-07-23 NOTE — ED Provider Notes (Signed)
[facility]    CSN: [id number] Arrival date & time: 07/23/22  1102      History   Chief Complaint Chief Complaint  Patient presents with   Cough    HPI Alexandra Trujillo is a 31 y.o. female.   HPI 27 female presents with cough congestion for 3 days.  Reports son is positive for strep pharyngitis and flu.  Reports taking leftover steroids from previous visit PMH significant for mild asthma, anxiety, and menorrhagia.  Past Medical History:  Diagnosis Date   Anxiety    Arthritis    hands   Asthma, mild    Chronic rhinitis    Environmental allergies    Heavy menstrual bleeding    History of cervical dysplasia    History of COVID-19    per pt has covid x3 first time early 2020 severy symptoms 3 months to resolved;  second time 2021 w/ moderate to severe , resolved in 2 wk;  third time 03/ 2022 mild symptoms that resolved   Hx of maternal laceration, 4th degree, currently pregnant 02/11/2014   1st pregnancy, no laceration on 3rd baby    Menstrual migraine    PMDD (premenstrual dysphoric disorder)    Wears contact lenses     Patient Active Problem List   Diagnosis Date Noted   Unwanted fertility    Dysplasia of cervix, low grade (CIN 1) 09/17/2021   Menorrhagia 09/17/2021   Obesity, Class I, BMI 30-34.9 09/09/2021   Allergic reaction 03/06/2021   Abscess 01/15/2021   Skin infection 01/12/2021   Sinusitis 12/16/2020   Rib pain 12/16/2020   BMI 32.0-32.9,adult 12/01/2020   Anxiety 12/01/2020   Environmental allergies 04/26/2018   Aphthous ulcer of mouth 04/26/2018   Perennial allergic rhinitis 04/26/2018   Menstrual migraine without status migrainosus, not intractable 04/26/2018   Rubella non-immune status, antepartum 02/11/2014    Past Surgical History:  Procedure Laterality Date   DENTAL RESTORATION/EXTRACTION WITH X-RAY     age 53   DILITATION & CURRETTAGE/HYSTROSCOPY WITH NOVASURE ABLATION  11/04/2021   Procedure: DILATATION &  CURETTAGE/HYSTEROSCOPY WITH NOVASURE ABLATION;  Surgeon: Milas Hock, MD;  Location: [facility] [place];  Service: Gynecology;;   LAPAROSCOPIC BILATERAL SALPINGECTOMY Bilateral 11/04/2021   Procedure: LAPAROSCOPIC BILATERAL SALPINGECTOMY;  Surgeon: Milas Hock, MD;  Location: [facility] [place];  Service: Gynecology;  Laterality: Bilateral;   LAPAROSCOPY  2012   retrival IUD from pelvic area    OB History     Gravida  4   Para  4   Term  4   Preterm      AB      Living  4      SAB      IAB      Ectopic      Multiple  0   Live Births  4            Home Medications    Prior to Admission medications   Medication Sig Start Date End Date Taking? Authorizing Provider  benzonatate (TESSALON) 200 MG capsule Take 1 capsule (200 mg total) by mouth 3 (three) times daily as needed for up to 7 days. 07/23/22 07/30/22 Yes Trevor Iha, FNP  oseltamivir (TAMIFLU) 75 MG capsule Take 1 capsule (75 mg total) by mouth every 12 (twelve) hours. 07/23/22  Yes Trevor Iha, FNP  promethazine-dextromethorphan (PROMETHAZINE-DM) 6.25-15 MG/5ML syrup Take 5 mLs by mouth 2 (two) times daily as needed for cough. 07/23/22  Yes Trevor Iha, FNP  albuterol (VENTOLIN HFA) 108 (90 Base) MCG/ACT inhaler Inhale 2 puffs into the lungs every 6 (six) hours as needed for wheezing or shortness of breath. Patient not taking: Reported on 07/23/2022 07/28/21   Bing NeighborsHarris, Kimberly S, FNP  escitalopram (LEXAPRO) 20 MG tablet TAKE 1 TABLET(20 MG) BY MOUTH DAILY 01/19/22   de [place], Buren Kosaymond J, MD  ibuprofen (ADVIL) 800 MG tablet Take 1 tablet (800 mg total) by mouth 3 (three) times daily with meals as needed for headache, moderate pain or cramping. Patient not taking: Reported on 07/23/2022 11/04/21   Milas Hockuncan, Paula, MD  levocetirizine (XYZAL) 5 MG tablet Take 5 mg by mouth every evening.    [provider]  lidocaine (XYLOCAINE) 2 % solution Use as directed 15 mLs in the mouth or  throat as needed for mouth pain. Apply to canker sores in mouth using cotton tipped applicator as needed. Patient not taking: Reported on 07/23/2022 11/04/20   Waldon MerlMartin, William C, PA-C  liraglutide (VICTOZA) 18 MG/3ML SOPN Inject 1.8 mg into the skin daily. Patient not taking: Reported on 11/02/2021 09/21/21   de [place], Buren Kosaymond J, MD  montelukast (SINGULAIR) 10 MG tablet TAKE 1 TABLET(10 MG) BY MOUTH AT BEDTIME 04/13/22   de [place], Buren Kosaymond J, MD  ondansetron (ZOFRAN-ODT) 4 MG disintegrating tablet Take 1 tablet (4 mg total) by mouth every 8 (eight) hours as needed for nausea or vomiting. Patient not taking: Reported on 07/23/2022 09/24/21   Waldon MerlMartin, William C, PA-C  oxyCODONE (OXY IR/ROXICODONE) 5 MG immediate release tablet Take 1 tablet (5 mg total) by mouth every 4 (four) hours as needed for severe pain or breakthrough pain. Patient not taking: Reported on 07/23/2022 11/04/21   Milas Hockuncan, Paula, MD  predniSONE (DELTASONE) 20 MG tablet Take 2 tablets (40 mg total) by mouth daily with breakfast. Patient not taking: Reported on 07/23/2022 06/01/22   Waldon MerlMartin, William C, PA-C  rizatriptan (MAXALT-MLT) 5 MG disintegrating tablet Take 1 tablet (5 mg total) by mouth as needed for migraine. May repeat in 2 hours if needed. Needs appointment. Patient not taking: Reported on 07/23/2022 07/22/21   de [place], Buren Kosaymond J, MD    Family History Family History  Problem Relation Age of Onset   Thyroid disease Maternal Aunt    Stroke Maternal Grandmother    Hypertension Maternal Grandmother    Asthma Mother    Healthy Father     Social History Social History   Tobacco Use   Smoking status: Never   Smokeless tobacco: Never  Vaping Use   Vaping Use: Never used  Substance Use Topics   Alcohol use: Not Currently   Drug use: Never     Allergies   Tree extract, Peanut-containing drug products, and Clavulanic acid   Review of Systems Review of Systems  Constitutional:  Positive for fever.  HENT:  Positive for  congestion.   Respiratory:  Positive for cough.   All other systems reviewed and are negative.    Physical Exam Triage Vital Signs ED Triage Vitals  Enc Vitals Group     BP 07/23/22 1237 (!) 128/94     Pulse Rate 07/23/22 1237 86     Resp 07/23/22 1237 (!) 22     Temp 07/23/22 1237 99.2 F (37.3 C)     Temp Source 07/23/22 1237 Oral     SpO2 07/23/22 1237 98 %     Weight 07/23/22 1240 150 lb (68 kg)     Height 07/23/22 1240 4\' 11"  (1.499 m)  Head Circumference --      Peak Flow --      Pain Score 07/23/22 1240 5     Pain Loc --      Pain Edu? --      Excl. in GC? --    No data found.  Updated Vital Signs BP (!) 128/94 (BP Location: Right Arm)   Pulse 86   Temp 99.2 F (37.3 C) (Oral)   Resp (!) 22   Ht 4\' 11"  (1.499 m)   Wt 150 lb (68 kg)   SpO2 98%   BMI 30.30 kg/m    Physical Exam Vitals and nursing note reviewed.  Constitutional:      Appearance: Normal appearance. She is obese. She is ill-appearing.  HENT:     Head: Normocephalic and atraumatic.     Right Ear: Tympanic membrane, ear canal and external ear normal.     Left Ear: Tympanic membrane, ear canal and external ear normal.     Mouth/Throat:     Mouth: Mucous membranes are moist.     Pharynx: Oropharynx is clear.  Eyes:     Extraocular Movements: Extraocular movements intact.     Conjunctiva/sclera: Conjunctivae normal.     Pupils: Pupils are equal, round, and reactive to light.  Cardiovascular:     Rate and Rhythm: Normal rate and regular rhythm.  Pulmonary:     Effort: Pulmonary effort is normal.     Breath sounds: Normal breath sounds. No wheezing, rhonchi or rales.     Comments: Frequent nonproductive cough noted on exam Musculoskeletal:        General: Normal range of motion.     Cervical back: Neck supple. Tenderness present.  Lymphadenopathy:     Cervical: Cervical adenopathy present.  Skin:    General: Skin is warm and dry.  Neurological:     General: No focal deficit present.      Mental Status: She is alert and oriented to person, place, and time. Mental status is at baseline.      UC Treatments / Results  Labs (all labs ordered are listed, but only abnormal results are displayed) Labs Reviewed  RESP PANEL BY RT-PCR (FLU A&B, COVID) [id number]    EKG   Radiology No results found.  Procedures Procedures (including critical care time)  Medications Ordered in UC Medications - No data to display  Initial Impression / Assessment and Plan / UC Course  I have reviewed the triage vital signs and the nursing notes.  Pertinent labs & imaging results that were available during my care of the patient were reviewed by me and considered in my medical decision making (see chart for details).     MDM 1. Influenza-like illness-although rapid influenza negative, will treat with Tamiflu due to given exposure of her son; 2.  Fever-advised Tylenol-lab ordered. 3. Fever- Advised patient to take prednisone with first dose of Tamiflu for the next 5 days.  Advised may use Tessalon daily as needed for cough.  Advised patient may use Promethazine DM at night for cough due to sedative effects.  Advised not to use cough medications together.  Encouraged patient to increase daily water intake to 64 ounces per day while taking these medications.  Advised we will follow-up with COVID-19/influenza results once received. Final Clinical Impressions(s) / UC Diagnoses   Final diagnoses:  Influenza-like illness  Cough, unspecified type     Discharge Instructions      Instructed patient to take medication as directed with  food to completion.  Advised patient to take prednisone with first dose of Tamiflu for the next 5 days.  Advised may use Tessalon daily as needed for cough.  Advised patient may use Promethazine DM at night for cough due to sedative effects.  Advised not to use cough medications together.  Encouraged patient to increase daily water intake to 64 ounces per day  while taking these medications.  Advised we will follow-up with COVID-19/influenza results once received.     ED Prescriptions     Medication Sig Dispense Auth. Provider   oseltamivir (TAMIFLU) 75 MG capsule Take 1 capsule (75 mg total) by mouth every 12 (twelve) hours. 10 capsule Trevor Iha, FNP   benzonatate (TESSALON) 200 MG capsule Take 1 capsule (200 mg total) by mouth 3 (three) times daily as needed for up to 7 days. 40 capsule Trevor Iha, FNP   promethazine-dextromethorphan (PROMETHAZINE-DM) 6.25-15 MG/5ML syrup Take 5 mLs by mouth 2 (two) times daily as needed for cough. 118 mL Trevor Iha, FNP      PDMP not reviewed this encounter.   Trevor Iha, FNP 07/23/22 1330

## 2022-07-23 NOTE — Progress Notes (Signed)
Patient called.  Left message for patient to call back. Instructions left for pt to start tamiflu prescribed at visit. You are positive for flu A & negative for COVID.

## 2022-08-10 ENCOUNTER — Telehealth: Payer: BC Managed Care – PPO | Admitting: Family Medicine

## 2022-08-10 DIAGNOSIS — B9689 Other specified bacterial agents as the cause of diseases classified elsewhere: Secondary | ICD-10-CM

## 2022-08-10 DIAGNOSIS — J019 Acute sinusitis, unspecified: Secondary | ICD-10-CM

## 2022-08-10 MED ORDER — DOXYCYCLINE HYCLATE 100 MG PO TABS: 100.0000 mg | ORAL_TABLET | Freq: Two times a day (BID) | ORAL | 0 refills | Status: AC

## 2022-08-10 NOTE — Progress Notes (Signed)

## 2022-08-19 ENCOUNTER — Ambulatory Visit: Payer: BC Managed Care – PPO | Admitting: Obstetrics and Gynecology

## 2022-09-02 ENCOUNTER — Ambulatory Visit: Payer: BC Managed Care – PPO | Admitting: Obstetrics and Gynecology

## 2022-09-19 NOTE — Progress Notes (Unsigned)
   ANNUAL EXAM Patient name: Alexandra Trujillo MRN [id number]  Date of birth: 12-10-1990 Chief Complaint:   No chief complaint on file.  History of Present Illness:   Alexandra Trujillo is a 32 y.o. [phone] female being seen today for a routine annual exam.   Current complaints: ***  No LMP recorded. Patient has had an ablation.  Current birth control: Salpingectomy  Last pap:  2015: pap/HPV wnl 2019: Pap normal Jan 2023--LSIL >Colpo CIN 1  Health Maintenance Due  Topic Date Due   Hepatitis C Screening  Never done   HPV VACCINES (3 - 3-dose SCDM series) 02/28/2022   INFLUENZA VACCINE  Never done   COVID-19 Vaccine (3 - 2023-24 season) 04/09/2022    Review of Systems:   Pertinent items are noted in HPI Denies any headaches, blurred vision, fatigue, shortness of breath, chest pain, abdominal pain, abnormal vaginal discharge/itching/odor/irritation, problems with periods, bowel movements, urination, or intercourse unless otherwise stated above. *** Pertinent History Reviewed:  Reviewed past medical,surgical, social and family history.  Reviewed problem list, medications and allergies. Physical Assessment:  There were no vitals filed for this visit.There is no height or weight on file to calculate BMI.   Physical Examination:  General appearance - well appearing, and in no distress Mental status - alert, oriented to person, place, and time Psych:  She has a normal mood and affect Skin - warm and dry, normal color, no suspicious lesions noted Chest - effort normal Heart - normal rate  Breasts - breasts appear normal, no suspicious masses, no skin or nipple changes or axillary nodes Abdomen - soft, nontender, nondistended, no masses or organomegaly Pelvic -  VULVA: normal appearing vulva with no masses, tenderness or lesions  VAGINA: normal appearing vagina with normal color and discharge, no lesions  CERVIX: normal appearing cervix without discharge or lesions, no  CMT UTERUS: uterus is felt to be normal size, shape, consistency and nontender  ADNEXA: No adnexal masses or tenderness noted. Extremities:  No swelling or varicosities noted  Chaperone present for exam  No results found for this or any previous visit (from the past 24 hour(s)).  Assessment & Plan:  Diagnoses and all orders for this visit:  Dysplasia of cervix, low grade (CIN 1)  Encounter for annual routine gynecological examination  - Cervical cancer screening: Discussed guidelines. Pap with HPV done - Gardasil: completed 2/3 - GC/CT: {Blank single:19197::"accepts","declines","not indicated"} - Birth Control:  Salpingectomy - Breast Health: Encouraged self breast awareness/SBE. Teaching provided.  - F/U 12 months and prn     No orders of the defined types were placed in this encounter.   Meds: No orders of the defined types were placed in this encounter.   Follow-up: No follow-ups on file.  Radene Gunning, MD 09/19/2022 11:50 AM

## 2022-09-22 ENCOUNTER — Other Ambulatory Visit (HOSPITAL_COMMUNITY)
Admission: RE | Admit: 2022-09-22 | Discharge: 2022-09-22 | Disposition: A | Discharge status: Home / Self Care | Payer: BC Managed Care – PPO | Source: Ambulatory Visit | Attending: Obstetrics and Gynecology | Admitting: Obstetrics and Gynecology

## 2022-09-22 ENCOUNTER — Encounter: Payer: Self-pay | Admitting: Obstetrics and Gynecology

## 2022-09-22 ENCOUNTER — Ambulatory Visit (INDEPENDENT_AMBULATORY_CARE_PROVIDER_SITE_OTHER): Payer: BC Managed Care – PPO | Admitting: Obstetrics and Gynecology

## 2022-09-22 VITALS — BP 126/76 | HR 75 | Ht 59.0 in | Wt 153.0 lb

## 2022-09-22 DIAGNOSIS — Z01419 Encounter for gynecological examination (general) (routine) without abnormal findings: Secondary | ICD-10-CM | POA: Diagnosis not present

## 2022-09-22 DIAGNOSIS — N870 Mild cervical dysplasia: Secondary | ICD-10-CM | POA: Diagnosis not present

## 2022-09-27 LAB — CYTOLOGY - PAP
Comment: NEGATIVE
Diagnosis: REACTIVE
High risk HPV: NEGATIVE

## 2022-10-03 ENCOUNTER — Ambulatory Visit (INDEPENDENT_AMBULATORY_CARE_PROVIDER_SITE_OTHER): Payer: BC Managed Care – PPO

## 2022-10-03 ENCOUNTER — Ambulatory Visit
Admission: EM | Admit: 2022-10-03 | Discharge: 2022-10-03 | Disposition: A | Discharge status: Home / Self Care | Payer: BC Managed Care – PPO | Attending: Emergency Medicine | Admitting: Emergency Medicine

## 2022-10-03 ENCOUNTER — Other Ambulatory Visit: Payer: Self-pay

## 2022-10-03 DIAGNOSIS — S8011XA Contusion of right lower leg, initial encounter: Secondary | ICD-10-CM

## 2022-10-03 DIAGNOSIS — M25571 Pain in right ankle and joints of right foot: Secondary | ICD-10-CM

## 2022-10-03 DIAGNOSIS — M79671 Pain in right foot: Secondary | ICD-10-CM | POA: Diagnosis not present

## 2022-10-03 DIAGNOSIS — M79604 Pain in right leg: Secondary | ICD-10-CM

## 2022-10-03 MED ORDER — IBUPROFEN 800 MG PO TABS
800.0000 mg | ORAL_TABLET | Freq: Once | ORAL | Status: AC
  Administered 2022-10-03: 800 mg via ORAL

## 2022-10-03 MED ORDER — IBUPROFEN 600 MG PO TABS: 600.0000 mg | ORAL_TABLET | Freq: Three times a day (TID) | ORAL | 0 refills | Status: DC | PRN

## 2022-10-03 NOTE — ED Triage Notes (Signed)
Pt presents to Urgent Care with c/o R ankle, foot, and lower leg pain following injury approx one hr ago. 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

## 2022-10-03 NOTE — Discharge Instructions (Signed)
The x-rays of your ankle and foot did not reveal any acute bony fracture.  Because your ankle may feel unstable for a few days, I recommend that you avoid bearing weight until it is feeling better.  We have provided you with a set of crutches for this.  Once you feel comfortable walking again, I would like for you to begin wearing a lace up ankle brace for at least 2 weeks.  This brace will keep your ankle from rolling again.  I also recommended to apply ice to your lower leg, particularly that goose egg, 3-4 times a day for 20 minutes each time to help get the swelling down.  I sent a prescription for ibuprofen 600 mg to your pharmacy that you take every 8 hours as needed.

## 2022-10-03 NOTE — ED Provider Notes (Signed)
[facility]    CSN: [id number] Arrival date & time: 10/03/22  1431    HISTORY  No chief complaint on file.  HPI Alexandra Trujillo is a pleasant, 32 y.o. female who presents to urgent care today. Patient complains of falling while trying to pull a 200 pound wheelbarrow of muck across her pig [place].  Patient states her children were pushing the wheelbarrow while she was pulling and, while doing so, she tripped over a pile of bricks that she did not see.  Patient states when she fell she rolled her right ankle outward and the 200 pound wheelbarrow fell on top of her right lower leg.  Patient states she has a goose egg on the front of her shin bone and she has pain radiating from that bump down to the tips of her toes as well as pain on the right side of her foot.  Patient reports pain with ambulation, she is sitting in a wheelchair as I see her today.  Patient denies prior trauma to her right lower leg, ankle and foot in the past.  Patient states she is not taking anything for pain as of yet.  The history is provided by the patient.   Past Medical History:  Diagnosis Date   Anxiety    Arthritis    hands   Asthma, mild    Chronic rhinitis    Environmental allergies    Heavy menstrual bleeding    History of cervical dysplasia    History of COVID-19    per pt has covid x3 first time early 2020 severy symptoms 3 months to resolved;  second time 2021 w/ moderate to severe , resolved in 2 wk;  third time 03/ 2022 mild symptoms that resolved   Hx of maternal laceration, 4th degree, currently pregnant 02/11/2014   1st pregnancy, no laceration on 3rd baby    Menstrual migraine    PMDD (premenstrual dysphoric disorder)    Wears contact lenses    Patient Active Problem List   Diagnosis Date Noted   Dysplasia of cervix, low grade (CIN 1) 09/17/2021   Obesity, Class I, BMI 30-34.9 09/09/2021   Anxiety 12/01/2020   Perennial allergic rhinitis 04/26/2018   Menstrual  migraine without status migrainosus, not intractable 04/26/2018   Past Surgical History:  Procedure Laterality Date   DENTAL RESTORATION/EXTRACTION WITH X-RAY     age 79   [place]  11/04/2021   Procedure: DILATATION & CURETTAGE/HYSTEROSCOPY WITH NOVASURE ABLATION;  Surgeon: Radene Gunning, MD;  Location: [facility];  Service: Gynecology;;   LAPAROSCOPIC BILATERAL SALPINGECTOMY Bilateral 11/04/2021   Procedure: LAPAROSCOPIC BILATERAL SALPINGECTOMY;  Surgeon: Radene Gunning, MD;  Location: [facility];  Service: Gynecology;  Laterality: Bilateral;   LAPAROSCOPY  2012   retrival IUD from pelvic area   OB History     Gravida  4   Para  4   Term  4   Preterm      AB      Living  4      SAB      IAB      Ectopic      Multiple  0   Live Births  4          Home Medications    Prior to Admission medications   Medication Sig Start Date End Date Taking? Authorizing Provider  escitalopram (LEXAPRO) 20 MG tablet TAKE 1 TABLET(20 MG) BY MOUTH  DAILY 01/19/22   [place], Blondell Reveal, MD  levocetirizine (XYZAL) 5 MG tablet Take 5 mg by mouth every evening.    [provider]  montelukast (SINGULAIR) 10 MG tablet TAKE 1 TABLET(10 MG) BY MOUTH AT BEDTIME Patient not taking: Reported on 09/22/2022 04/13/22   [place], Blondell Reveal, MD    Family History Family History  Problem Relation Age of Onset   Thyroid disease Maternal Aunt    Stroke Maternal Grandmother    Hypertension Maternal Grandmother    Asthma Mother    Healthy Father    Social History Social History   Tobacco Use   Smoking status: Never   Smokeless tobacco: Never  Vaping Use   Vaping Use: Never used  Substance Use Topics   Alcohol use: Not Currently   Drug use: Never   Allergies   Tree extract, Peanut-containing drug products, and Clavulanic acid  Review of Systems Review of Systems Pertinent findings revealed after  performing a 14 point review of systems has been noted in the history of present illness.  Physical Exam Vital Signs There were no vitals taken for this visit.  No data found.  Physical Exam Vitals and nursing note reviewed.  Constitutional:      General: She is not in acute distress.    Appearance: Normal appearance.  HENT:     Head: Normocephalic and atraumatic.  Eyes:     Pupils: Pupils are equal, round, and reactive to light.  Cardiovascular:     Rate and Rhythm: Normal rate and regular rhythm.  Pulmonary:     Effort: Pulmonary effort is normal.     Breath sounds: Normal breath sounds.  Musculoskeletal:        General: Normal range of motion.     Cervical back: Normal range of motion and neck supple.     Right lower leg: Swelling, tenderness and bony tenderness present. No lacerations.     Right ankle: Normal. Normal range of motion.     Right Achilles Tendon: Normal.     Right foot: Normal range of motion and normal capillary refill. Tenderness and bony tenderness present. No crepitus. Normal pulse.  Skin:    General: Skin is warm and dry.  Neurological:     General: No focal deficit present.     Mental Status: She is alert and oriented to person, place, and time. Mental status is at baseline.  Psychiatric:        Mood and Affect: Mood normal.        Behavior: Behavior normal.        Thought Content: Thought content normal.        Judgment: Judgment normal.     UC Couse / Diagnostics / Procedures:     Radiology No results found.  Procedures Procedures (including critical care time) EKG  Pending results:  Labs Reviewed - No data to display  Medications Ordered in UC: Medications - No data to display  UC Diagnoses / Final Clinical Impressions(s)   I have reviewed the triage vital signs and the nursing notes.  Pertinent labs & imaging results that were available during my care of the patient were reviewed by me and considered in my medical decision  making (see chart for details).    Final diagnoses:  None    {LOWERBACKPAINPLAN:26747}  Please see discharge instructions below for details of plan of care as provided to patient. ED Prescriptions   None    PDMP not reviewed this encounter.  Discharge Instructions: Discharge Instructions   None     Disposition Upon Discharge:  Condition: stable for discharge home Home: take medications as prescribed; routine discharge instructions as discussed; follow up as advised.  Patient presented with an acute illness with associated systemic symptoms and significant discomfort requiring urgent management. In my opinion, this is a condition that a prudent lay person (someone who possesses an average knowledge of health and medicine) may potentially expect to result in complications if not addressed urgently such as respiratory distress, impairment of bodily function or dysfunction of bodily organs.   Routine symptom specific, illness specific and/or disease specific instructions were discussed with the patient and/or caregiver at length.   As such, the patient has been evaluated and assessed, work-up was performed and treatment was provided in alignment with urgent care protocols and evidence based medicine.  Patient/parent/caregiver has been advised that the patient may require follow up for further testing and treatment if the symptoms continue in spite of treatment, as clinically indicated and appropriate.  Patient/parent/caregiver has been advised to report to orthopedic urgent care clinic or return to the [facility] or PCP in 3-5 days if no better; follow-up with orthopedics, PCP or the Emergency Department if new signs and symptoms develop or if the current signs or symptoms continue to change or worsen for further workup, evaluation and treatment as clinically indicated and appropriate  The patient will follow up with their current PCP if and as advised. If the patient does not currently have a  PCP we will have assisted them in obtaining one.   The patient may need specialty follow up if the symptoms continue, in spite of conservative treatment and management, for further workup, evaluation, consultation and treatment as clinically indicated and appropriate.  Patient/parent/caregiver verbalized understanding and agreement of plan as discussed.  All questions were addressed during visit.  Please see discharge instructions below for further details of plan.  This office note has been dictated using Museum/gallery curator.  Unfortunately, this method of dictation can sometimes lead to typographical or grammatical errors.  I apologize for your inconvenience in advance if this occurs.  Please do not hesitate to reach out to me if clarification is needed.

## 2022-10-16 NOTE — Telephone Encounter (Signed)
Telephone call note initiated in error.

## 2022-10-28 ENCOUNTER — Telehealth: Payer: BC Managed Care – PPO | Admitting: Nurse Practitioner

## 2022-10-28 DIAGNOSIS — J0140 Acute pansinusitis, unspecified: Secondary | ICD-10-CM

## 2022-10-28 MED ORDER — DOXYCYCLINE HYCLATE 100 MG PO TABS: 100.0000 mg | ORAL_TABLET | Freq: Two times a day (BID) | ORAL | 0 refills | Status: AC

## 2022-10-28 NOTE — Progress Notes (Signed)
E-Visit for Sinus Problems  We are sorry that you are not feeling well.  Here is how we plan to help!  Based on what you have shared with me it looks like you have sinusitis.  Sinusitis is inflammation and infection in the sinus cavities of the head.  Based on your presentation I believe you most likely have Acute Bacterial Sinusitis.  This is an infection caused by bacteria and is treated with antibiotics. I have prescribed Doxycycline 100mg by mouth twice a day for 10 days. You may use an oral decongestant such as Mucinex D or if you have glaucoma or high blood pressure use plain Mucinex. Saline nasal spray help and can safely be used as often as needed for congestion.  If you develop worsening sinus pain, fever or notice severe headache and vision changes, or if symptoms are not better after completion of antibiotic, please schedule an appointment with a health care provider.    Sinus infections are not as easily transmitted as other respiratory infection, however we still recommend that you avoid close contact with loved ones, especially the very young and elderly.  Remember to wash your hands thoroughly throughout the day as this is the number one way to prevent the spread of infection!  Home Care: Only take medications as instructed by your medical team. Complete the entire course of an antibiotic. Do not take these medications with alcohol. A steam or ultrasonic humidifier can help congestion.  You can place a towel over your head and breathe in the steam from hot water coming from a faucet. Avoid close contacts especially the very young and the elderly. Cover your mouth when you cough or sneeze. Always remember to wash your hands.  Get Help Right Away If: You develop worsening fever or sinus pain. You develop a severe head ache or visual changes. Your symptoms persist after you have completed your treatment plan.  Make sure you Understand these instructions. Will watch your  condition. Will get help right away if you are not doing well or get worse.  Thank you for choosing an e-visit.  Your e-visit answers were reviewed by a board certified advanced clinical practitioner to complete your personal care plan. Depending upon the condition, your plan could have included both over the counter or prescription medications.  Please review your pharmacy choice. Make sure the pharmacy is open so you can pick up prescription now. If there is a problem, you may contact your provider through MyChart messaging and have the prescription routed to another pharmacy.  Your safety is important to us. If you have drug allergies check your prescription carefully.   For the next 24 hours you can use MyChart to ask questions about today's visit, request a non-urgent call back, or ask for a work or school excuse. You will get an email in the next two days asking about your experience. I hope that your e-visit has been valuable and will speed your recovery.   Meds ordered this encounter  Medications   doxycycline (VIBRA-TABS) 100 MG tablet    Sig: Take 1 tablet (100 mg total) by mouth 2 (two) times daily for 10 days.    Dispense:  20 tablet    Refill:  0    I spent approximately 5 minutes reviewing the patient's history, current symptoms and coordinating their care today.   

## 2022-12-30 ENCOUNTER — Other Ambulatory Visit (HOSPITAL_BASED_OUTPATIENT_CLINIC_OR_DEPARTMENT_OTHER): Payer: Self-pay | Admitting: Family Medicine

## 2022-12-30 DIAGNOSIS — F419 Anxiety disorder, unspecified: Secondary | ICD-10-CM

## 2023-01-26 ENCOUNTER — Telehealth: Payer: BC Managed Care – PPO | Admitting: Physician Assistant

## 2023-01-26 ENCOUNTER — Telehealth: Payer: BC Managed Care – PPO | Admitting: Nurse Practitioner

## 2023-01-26 DIAGNOSIS — G43719 Chronic migraine without aura, intractable, without status migrainosus: Secondary | ICD-10-CM

## 2023-01-26 DIAGNOSIS — R519 Headache, unspecified: Secondary | ICD-10-CM

## 2023-01-26 MED ORDER — PREDNISONE 20 MG PO TABS: 40.0000 mg | ORAL_TABLET | Freq: Every day | ORAL | 0 refills | Status: DC

## 2023-01-26 MED ORDER — RIZATRIPTAN BENZOATE 5 MG PO TBDP: 5.0000 mg | ORAL_TABLET | Freq: Once | ORAL | 0 refills | Status: AC | PRN

## 2023-01-26 NOTE — Progress Notes (Signed)
Virtual Visit Consent   JOREY WILSEY, you are scheduled for a virtual visit with a [facility] provider today. Just as with appointments in the office, your consent must be obtained to participate. Your consent will be active for this visit and any virtual visit you may have with one of our providers in the next 365 days. If you have a MyChart account, a copy of this consent can be sent to you electronically.  As this is a virtual visit, video technology does not allow for your provider to perform a traditional examination. This may limit your provider's ability to fully assess your condition. If your provider identifies any concerns that need to be evaluated in person or the need to arrange testing (such as labs, EKG, etc.), we will make arrangements to do so. Although advances in technology are sophisticated, we cannot ensure that it will always work on either your end or our end. If the connection with a video visit is poor, the visit may have to be switched to a telephone visit. With either a video or telephone visit, we are not always able to ensure that we have a secure connection.  By engaging in this virtual visit, you consent to the provision of healthcare and authorize for your insurance to be billed (if applicable) for the services provided during this visit. Depending on your insurance coverage, you may receive a charge related to this service.  I need to obtain your verbal consent now. Are you willing to proceed with your visit today? JASSEL MOSCHETTO has provided verbal consent on 01/26/2023 for a virtual visit (video or telephone). Piedad Climes, [place]  Date: 01/26/2023 9:13 AM  Virtual Visit via Video Note   I, Piedad Climes, connected with  ABRAH KURZ  ([id number], 1990-09-23) on 01/26/23 at  9:15 AM EDT by a video-enabled telemedicine application and verified that I am speaking with the correct person using two identifiers.  Location: Patient: Virtual Visit  Location Patient: Home Provider: Virtual Visit Location Provider: Home Office   I discussed the limitations of evaluation and management by telemedicine and the availability of in person appointments. The patient expressed understanding and agreed to proceed.    History of Present Illness: Alexandra Trujillo is a 32 y.o. who identifies as a female who was assigned female at birth, and is being seen today for episodic migraines over the past 3 weeks. Notes history of menstrual migraines. Out of her abortive medications -- Rizatriptan -- as they have been used up in the past few weeks. Notes headache is now more generalized than unilateral and associated with photophobia and nausea without vomiting or other vision changes. Was responsive to her Maxalt but kept recurring. Ibuprofen OTC helps keep headache mild but still present.      HPI: HPI  Problems:  Patient Active Problem List   Diagnosis Date Noted   Dysplasia of cervix, low grade (CIN 1) 09/17/2021   Obesity, Class I, BMI 30-34.9 09/09/2021   Anxiety 12/01/2020   Perennial allergic rhinitis 04/26/2018   Menstrual migraine without status migrainosus, not intractable 04/26/2018    Allergies:  Allergies  Allergen Reactions   Tree Extract Anaphylaxis, Itching and Other (See Comments)    Plant based food, etc   Peanut-Containing Drug Products Other (See Comments)    Headache, sores in mouth   Clavulanic Acid Hives    Patient tolerates Amoxicillin well    Medications:  Current Outpatient Medications:    predniSONE (DELTASONE) 20  MG tablet, Take 2 tablets (40 mg total) by mouth daily with breakfast., Disp: 10 tablet, Rfl: 0   escitalopram (LEXAPRO) 20 MG tablet, TAKE 1 TABLET(20 MG) BY MOUTH DAILY, Disp: 90 tablet, Rfl: 1   levocetirizine (XYZAL) 5 MG tablet, Take 5 mg by mouth every evening., Disp: , Rfl:    montelukast (SINGULAIR) 10 MG tablet, TAKE 1 TABLET(10 MG) BY MOUTH AT BEDTIME, Disp: 90 tablet, Rfl:  1  Observations/Objective: Patient is well-developed, well-nourished in no acute distress.  Resting comfortably  at home.  Head is normocephalic, atraumatic.  No labored breathing.  Speech is clear and coherent with logical content.  Patient is alert and oriented at baseline.  Neuro grossly intact.   Assessment and Plan: 1. Intractable chronic migraine without aura and without status migrainosus - predniSONE (DELTASONE) 20 MG tablet; Take 2 tablets (40 mg total) by mouth daily with breakfast.  Dispense: 10 tablet; Refill: 0  Will start burst of prednisone to break this headache cycle. Supportive measures and OTC medications reviewed. Will give refill of her Maxalt to have for future need. If symptoms not calming down within 48 hours or anything worsens, she needs in person evaluation ASAP.   Follow Up Instructions: I discussed the assessment and treatment plan with the patient. The patient was provided an opportunity to ask questions and all were answered. The patient agreed with the plan and demonstrated an understanding of the instructions.  A copy of instructions were sent to the patient via MyChart unless otherwise noted below.   The patient was advised to call back or seek an in-person evaluation if the symptoms worsen or if the condition fails to improve as anticipated.  Time:  I spent 10 minutes with the patient via telehealth technology discussing the above problems/concerns.    Piedad Climes, PA-C

## 2023-01-26 NOTE — Patient Instructions (Signed)
  [name], thank you for joining Piedad Climes, PA-C for today's virtual visit.  While this provider is not your primary care provider (PCP), if your PCP is located in our provider database this encounter information will be shared with them immediately following your visit.   A [facility] MyChart account gives you access to today's visit and all your visits, tests, and labs performed at [facility] " click here if you don't have a [facility] MyChart account or go to [email or web address]  Consent: (Patient) Alexandra Trujillo provided verbal consent for this virtual visit at the beginning of the encounter.  Current Medications:  Current Outpatient Medications:    predniSONE (DELTASONE) 20 MG tablet, Take 2 tablets (40 mg total) by mouth daily with breakfast., Disp: 10 tablet, Rfl: 0   escitalopram (LEXAPRO) 20 MG tablet, TAKE 1 TABLET(20 MG) BY MOUTH DAILY, Disp: 90 tablet, Rfl: 1   levocetirizine (XYZAL) 5 MG tablet, Take 5 mg by mouth every evening., Disp: , Rfl:    montelukast (SINGULAIR) 10 MG tablet, TAKE 1 TABLET(10 MG) BY MOUTH AT BEDTIME, Disp: 90 tablet, Rfl: 1   rizatriptan (MAXALT-MLT) 5 MG disintegrating tablet, Take 1 tablet (5 mg total) by mouth once as needed for up to 1 dose for migraine. May repeat in 2 hours if needed., Disp: 10 tablet, Rfl: 0   Medications ordered in this encounter:  Meds ordered this encounter  Medications   predniSONE (DELTASONE) 20 MG tablet    Sig: Take 2 tablets (40 mg total) by mouth daily with breakfast.    Dispense:  10 tablet    Refill:  0    Order Specific Question:   Supervising Provider    Answer:   Merrilee Jansky [[id number]]   rizatriptan (MAXALT-MLT) 5 MG disintegrating tablet    Sig: Take 1 tablet (5 mg total) by mouth once as needed for up to 1 dose for migraine. May repeat in 2 hours if needed.    Dispense:  10 tablet    Refill:  0    Order Specific Question:   Supervising Provider    Answer:    Merrilee Jansky [id number]     *If you need refills on other medications prior to your next appointment, please contact your pharmacy*  Follow-Up: Call back or seek an in-person evaluation if the symptoms worsen or if the condition fails to improve as anticipated.  [facility] [phone]  Other Instructions Keep hydrated and rest. Keep a well-balanced diet.  Have some caffeine today to help with headache. Take the prednisone as directed. If not breaking your headache cycle, you need an in-person evaluation. I did send in a small refill of your Maxalt to have for future migraines.   If you have been instructed to have an in-person evaluation today at a local Urgent Care facility, please use the link below. It will take you to a list of all of our available [facility], including address, phone number and hours of operation. Please do not delay care.  [facility]  If you or a family member do not have a primary care provider, use the link below to schedule a visit and establish care. When you choose a [facility] primary care physician or advanced practice provider, you gain a long-term partner in health. Find a Primary Care Provider  Learn more about [facility]'s in-office and virtual care options: [facility] - Get Care Now

## 2023-01-26 NOTE — Progress Notes (Signed)
Quinesha,  We cannot treat migraines over E-visit, that is why it is not listed. You can switch to a Video Visit option if you would like or seek treatment from an in person Urgent Care.  I will let you know we cannot prescribe stronger pain medication ( more than ibuprofen), and if you have needed that in the past you will need to visit an in person Urgent Care for treatment.    I feel your condition warrants further evaluation and I recommend that you be seen in a face to face visit.   NOTE: There will be NO CHARGE for this eVisit   If you are having a true medical emergency please call 911.      For an urgent face to face visit, [facility] has eight urgent care centers for your convenience:   NEW!! [facility] Center at [facility] Get Driving Directions [phone] [address] [place], [address]    [facility] Urgent Care Center at [facility] Get Driving Directions [phone] [address] [place], [place] [address]   [facility] Health Urgent Care Center [facility]) Get Driving Directions [phone] [address] [place], [place] [address]  [facility] Health Urgent Care Center [facility]) Get Driving Directions [phone] [address] [place],  [place]  [address]  [facility] Urgent Care Center [facility] - at [employer]  [phone] [address] [place],  [place] [address]   [facility] Health Urgent Care at [facility] Get Driving Directions [phone] [address], [address] [place], [place] [address]   [facility] Health Urgent Care at [facility] Get Driving Directions  [phone] [address] [place], [place] [address]   [facility] Health Urgent Care at [facility] Directions [phone] [address], [address] [place], [place] [address]  Your MyChart E-visit questionnaire answers were reviewed by a board  certified advanced clinical practitioner to complete your personal care plan based on your specific symptoms.  Thank you for using e-Visits.

## 2023-02-04 ENCOUNTER — Ambulatory Visit (HOSPITAL_BASED_OUTPATIENT_CLINIC_OR_DEPARTMENT_OTHER): Payer: BC Managed Care – PPO | Admitting: Family Medicine

## 2023-02-04 ENCOUNTER — Encounter (HOSPITAL_BASED_OUTPATIENT_CLINIC_OR_DEPARTMENT_OTHER): Payer: Self-pay | Admitting: Family Medicine

## 2023-02-04 VITALS — BP 140/89 | HR 75 | Temp 97.8°F | Ht 59.0 in | Wt 159.0 lb

## 2023-02-04 DIAGNOSIS — R002 Palpitations: Secondary | ICD-10-CM

## 2023-02-04 DIAGNOSIS — E669 Obesity, unspecified: Secondary | ICD-10-CM | POA: Diagnosis not present

## 2023-02-04 NOTE — Assessment & Plan Note (Signed)
Will check labs today.  Proceed accordingly for any laboratory abnormalities observed. If labs are generally reassuring, at future visit we can discuss additional considerations regarding management

## 2023-02-04 NOTE — Progress Notes (Signed)
    Procedures performed today:    EKG: Normal sinus rhythm, normal QT interval, no T wave abnormalities.  Independent interpretation of notes and tests performed by another provider:   None.  Brief History, Exam, Impression, and Recommendations:    BP (!) 140/89 (BP Location: Left Arm, Patient Position: Sitting, Cuff Size: Normal)   Pulse 75   Temp 97.8 F (36.6 C) (Oral)   Ht 4\' 11"  (1.499 m)   Wt 159 lb (72.1 kg)   SpO2 100%   BMI 32.11 kg/m   Unfortunately, last visit with patient was greater than 16 months ago.  At that time, she was advised on following up in 1 month.  She presents today with new symptoms as discussed below.  Palpitations Assessment & Plan: Patient reports that for some time now she has been dealing with intermittent palpitations.  She will have general feeling of unwell during these episodes.  These will be relatively short-lived.  When experiencing these symptoms, someone she knows suggested possibly checking her blood sugar to ensure that this was not a potential cause.  She reports that she had symptoms about 1 hour following consumption of 2% which is and blood sugar was found to be in the high 100s.  This caused her some concern.  She does report family history of blood sugar abnormality.  She does have a known history of migraines and has been dealing with increased frequency of headaches.  She has not had any obvious chest pain reported.  No significant shortness of breath, lightheadedness or dizziness. On exam, patient is in no acute distress, vital signs stable, patient is afebrile.  Cardiovascular exam with regular rate and rhythm, no murmur appreciated.  Lungs clear to auscultation bilaterally. Uncertain cause for ongoing intermittent symptoms.  Will proceed with EKG in office today.  We also proceed with initial laboratory evaluation as below. Discussed that if EKG is unremarkable, can also proceed with referral to cardiology for further review of  symptoms and consideration for event monitor, patient in agreement with this. Additional causes include breakthrough anxiety symptoms.  Will need to exclude any ongoing metabolic issues as well as potential underlying cardiac issues contribute to symptoms  Orders: -     CBC with Differential/Platelet -     Comprehensive metabolic panel -     Hemoglobin A1c -     TSH Rfx on Abnormal to Free T4 -     EKG 12-Lead -     Ambulatory referral to Cardiology  Obesity, Class I, BMI 30-34.9 Assessment & Plan: Will check labs today.  Proceed accordingly for any laboratory abnormalities observed. If labs are generally reassuring, at future visit we can discuss additional considerations regarding management   Return in about 6 weeks (around 03/18/2023) for palpitations.  Spent 34 minutes on this patient encounter, including preparation, chart review, face-to-face counseling with patient and coordination of care, and documentation of encounter   ___________________________________________ Jeb Schloemer de [place], MD, ABFM, [facility] and Sports [facility]

## 2023-02-04 NOTE — Patient Instructions (Signed)
  Medication Instructions:  Your physician recommends that you continue on your current medications as directed. Please refer to the Current Medication list given to you today. --If you need a refill on any your medications before your next appointment, please call your pharmacy first. If no refills are authorized on file call the office.-- Lab Work: Your physician has recommended that you have lab work today: Yes If you have labs (blood work) drawn today and your tests are completely normal, you will receive your results via MyChart message OR a phone call from our staff.  Please ensure you check your voicemail in the event that you authorized detailed messages to be left on a delegated number. If you have any lab test that is abnormal or we need to change your treatment, we will call you to review the results.  Referrals/Procedures/Imaging:   Follow-Up: Your next appointment:   Your physician recommends that you schedule a follow-up appointment in: 4-6 weeks with Dr. de Peru  You will receive a text message or e-mail with a link to a survey about your care and experience with [place] today! We would greatly appreciate your feedback!   Thanks for letting us be apart of your health journey!!  Primary Care and Sports Medicine   Dr. Ceasar Mons Peru   We encourage you to activate your patient portal called "MyChart".  Sign up information is provided on this After Visit Summary.  MyChart is used to connect with patients for Virtual Visits (Telemedicine).  Patients are able to view lab/test results, encounter notes, upcoming appointments, etc.  Non-urgent messages can be sent to your provider as well. To learn more about what you can do with MyChart, please visit --  [email or web address].

## 2023-02-04 NOTE — Assessment & Plan Note (Addendum)
Patient reports that for some time now she has been dealing with intermittent palpitations.  She will have general feeling of unwell during these episodes.  These will be relatively short-lived.  When experiencing these symptoms, someone she knows suggested possibly checking her blood sugar to ensure that this was not a potential cause.  She reports that she had symptoms about 1 hour following consumption of 2% which is and blood sugar was found to be in the high 100s.  This caused her some concern.  She does report family history of blood sugar abnormality.  She does have a known history of migraines and has been dealing with increased frequency of headaches.  She has not had any obvious chest pain reported.  No significant shortness of breath, lightheadedness or dizziness. On exam, patient is in no acute distress, vital signs stable, patient is afebrile.  Cardiovascular exam with regular rate and rhythm, no murmur appreciated.  Lungs clear to auscultation bilaterally. Uncertain cause for ongoing intermittent symptoms.  Will proceed with EKG in office today.  We also proceed with initial laboratory evaluation as below. Discussed that if EKG is unremarkable, can also proceed with referral to cardiology for further review of symptoms and consideration for event monitor, patient in agreement with this. Additional causes include breakthrough anxiety symptoms.  Will need to exclude any ongoing metabolic issues as well as potential underlying cardiac issues contribute to symptoms

## 2023-02-05 LAB — COMPREHENSIVE METABOLIC PANEL
ALT: 15 IU/L (ref 0–32)
AST: 16 IU/L (ref 0–40)
Albumin: 4.7 g/dL (ref 3.9–4.9)
Alkaline Phosphatase: 76 IU/L (ref 44–121)
BUN/Creatinine Ratio: 15 (ref 9–23)
BUN: 12 mg/dL (ref 6–20)
Bilirubin Total: 0.6 mg/dL (ref 0.0–1.2)
CO2: 19 mmol/L — ABNORMAL LOW (ref 20–29)
Calcium: 9 mg/dL (ref 8.7–10.2)
Chloride: 103 mmol/L (ref 96–106)
Creatinine, Ser: 0.81 mg/dL (ref 0.57–1.00)
Globulin, Total: 2.7 g/dL (ref 1.5–4.5)
Glucose: 90 mg/dL (ref 70–99)
Potassium: 4.3 mmol/L (ref 3.5–5.2)
Sodium: 140 mmol/L (ref 134–144)
Total Protein: 7.4 g/dL (ref 6.0–8.5)
eGFR: 98 mL/min/{1.73_m2} (ref >59)

## 2023-02-05 LAB — CBC WITH DIFFERENTIAL/PLATELET
Basophils Absolute: 0 10*3/uL (ref 0.0–0.2)
Basos: 0 %
EOS (ABSOLUTE): 0.2 10*3/uL (ref 0.0–0.4)
Eos: 2 %
Hematocrit: 41.6 % (ref 34.0–46.6)
Hemoglobin: 14.1 g/dL (ref 11.1–15.9)
Immature Grans (Abs): 0.1 10*3/uL (ref 0.0–0.1)
Immature Granulocytes: 1 %
Lymphocytes Absolute: 2.2 10*3/uL (ref 0.7–3.1)
Lymphs: 27 %
MCH: 30.7 pg (ref 26.6–33.0)
MCHC: 33.9 g/dL (ref 31.5–35.7)
MCV: 91 fL (ref 79–97)
Monocytes Absolute: 0.7 10*3/uL (ref 0.1–0.9)
Monocytes: 8 %
Neutrophils Absolute: 5 10*3/uL (ref 1.4–7.0)
Neutrophils: 62 %
Platelets: 279 10*3/uL (ref 150–450)
RBC: 4.59 x10E6/uL (ref 3.77–5.28)
RDW: 13.1 % (ref 11.7–15.4)
WBC: 8.1 10*3/uL (ref 3.4–10.8)

## 2023-02-05 LAB — HEMOGLOBIN A1C
Est. average glucose Bld gHb Est-mCnc: 105 mg/dL
Hgb A1c MFr Bld: 5.3 % (ref 4.8–5.6)

## 2023-02-05 LAB — TSH RFX ON ABNORMAL TO FREE T4: TSH: 0.778 u[IU]/mL (ref 0.450–4.500)

## 2023-03-21 ENCOUNTER — Ambulatory Visit (HOSPITAL_BASED_OUTPATIENT_CLINIC_OR_DEPARTMENT_OTHER): Payer: BC Managed Care – PPO | Admitting: Family Medicine

## 2023-04-18 ENCOUNTER — Ambulatory Visit (HOSPITAL_BASED_OUTPATIENT_CLINIC_OR_DEPARTMENT_OTHER): Payer: BC Managed Care – PPO | Admitting: Cardiology

## 2023-05-23 ENCOUNTER — Telehealth: Payer: BC Managed Care – PPO | Admitting: Emergency Medicine

## 2023-05-23 DIAGNOSIS — B9689 Other specified bacterial agents as the cause of diseases classified elsewhere: Secondary | ICD-10-CM

## 2023-05-23 DIAGNOSIS — J019 Acute sinusitis, unspecified: Secondary | ICD-10-CM | POA: Diagnosis not present

## 2023-05-23 MED ORDER — DOXYCYCLINE HYCLATE 100 MG PO TABS: 100.0000 mg | ORAL_TABLET | Freq: Two times a day (BID) | ORAL | 0 refills | Status: AC

## 2023-05-23 NOTE — Progress Notes (Signed)
E-Visit for Sinus Problems  We are sorry that you are not feeling well.  Here is how we plan to help!  Based on what you have shared with me it looks like you have sinusitis.  Sinusitis is inflammation and infection in the sinus cavities of the head.  Based on your presentation I believe you most likely have Acute Bacterial Sinusitis.  This is an infection caused by bacteria and is treated with antibiotics. I have prescribed Doxycycline 100mg  by mouth twice a day for 10 days.   You may use an oral decongestant such as Mucinex D or if you have glaucoma or high blood pressure use plain Mucinex.   Saline nasal spray help and can safely be used as often as needed for congestion.  Try using saline irrigation, such as with a neti pot, several times a day while you are sick. Many neti pots come with salt packets premeasured to use to make saline. If you use your own salt, make sure it is kosher salt or sea salt (don't use table salt as it has iodine in it and you don't need that in your nose). Use distilled water to make saline. If you mix your own saline using your own salt, the recipe is 1/4 teaspoon salt in 1 cup warm water. Using saline irrigation can help prevent and treat sinus infections.   If you develop worsening sinus pain, fever or notice severe headache and vision changes, or if symptoms are not better after completion of antibiotic, please schedule an appointment with a health care provider.    Sinus infections are not as easily transmitted as other respiratory infection, however we still recommend that you avoid close contact with loved ones, especially the very young and elderly.  Remember to wash your hands thoroughly throughout the day as this is the number one way to prevent the spread of infection!  Home Care: Only take medications as instructed by your medical team. Complete the entire course of an antibiotic. Do not take these medications with alcohol. A steam or ultrasonic humidifier  can help congestion.  You can place a towel over your head and breathe in the steam from hot water coming from a faucet. Avoid close contacts especially the very young and the elderly. Cover your mouth when you cough or sneeze. Always remember to wash your hands.  Get Help Right Away If: You develop worsening fever or sinus pain. You develop a severe head ache or visual changes. Your symptoms persist after you have completed your treatment plan.  Make sure you Understand these instructions. Will watch your condition. Will get help right away if you are not doing well or get worse.  Thank you for choosing an e-visit.  Your e-visit answers were reviewed by a board certified advanced clinical practitioner to complete your personal care plan. Depending upon the condition, your plan could have included both over the counter or prescription medications.  Please review your pharmacy choice. Make sure the pharmacy is open so you can pick up prescription now. If there is a problem, you may contact your provider through [employer] and have the prescription routed to another pharmacy.  Your safety is important to [place]. If you have drug allergies check your prescription carefully.   For the next 24 hours you can use MyChart to ask questions about today's visit, request a non-urgent call back, or ask for a work or school excuse. You will get an email in the next two days asking about  your experience. I hope that your e-visit has been valuable and will speed your recovery.  I have spent 5 minutes in review of e-visit questionnaire, review and updating patient chart, medical decision making and response to patient.   Rica Mast, PhD, FNP-BC

## 2023-07-22 ENCOUNTER — Ambulatory Visit: Payer: BC Managed Care – PPO | Attending: Cardiology

## 2023-07-22 ENCOUNTER — Encounter (HOSPITAL_BASED_OUTPATIENT_CLINIC_OR_DEPARTMENT_OTHER): Payer: Self-pay | Admitting: Cardiology

## 2023-07-22 ENCOUNTER — Ambulatory Visit (HOSPITAL_BASED_OUTPATIENT_CLINIC_OR_DEPARTMENT_OTHER): Payer: BC Managed Care – PPO | Admitting: Cardiology

## 2023-07-22 VITALS — BP 125/82 | HR 83 | Ht <= 58 in | Wt 161.0 lb

## 2023-07-22 DIAGNOSIS — Z8249 Family history of ischemic heart disease and other diseases of the circulatory system: Secondary | ICD-10-CM

## 2023-07-22 DIAGNOSIS — Z7189 Other specified counseling: Secondary | ICD-10-CM | POA: Diagnosis not present

## 2023-07-22 DIAGNOSIS — R002 Palpitations: Secondary | ICD-10-CM | POA: Diagnosis not present

## 2023-07-22 DIAGNOSIS — J3089 Other allergic rhinitis: Secondary | ICD-10-CM

## 2023-07-22 NOTE — Patient Instructions (Signed)
Medication Instructions:  Continue same medications   Lab Work: None ordered   Testing/Procedures: 2 week heart monitor ( ZIO ) will be mailed to your home   Follow-Up: At [facility], you and your health needs are our priority.  As part of our continuing mission to provide you with exceptional heart care, we have created designated Provider Care Teams.  These Care Teams include your primary Cardiologist (physician) and Advanced Practice Providers (APPs -  Physician Assistants and Nurse Practitioners) who all work together to provide you with the care you need, when you need it.  We recommend signing up for the patient portal called "MyChart".  Sign up information is provided on this After Visit Summary.  MyChart is used to connect with patients for Virtual Visits (Telemedicine).  Patients are able to view lab/test results, encounter notes, upcoming appointments, etc.  Non-urgent messages can be sent to your provider as well.   To learn more about what you can do with MyChart, go to [email or web address].    Your next appointment:  As Needed    Provider:  Dr.Christopher

## 2023-07-22 NOTE — Progress Notes (Unsigned)
Enrolled patient for a 14 day Zio XT  monitor to be mailed to patients home  °

## 2023-07-22 NOTE — Progress Notes (Signed)
Cardiology Office Note:  .   Date:  07/22/2023  ID:  [name], DOB [birth year], MRN [id number] PCP: de Peru, Raymond J, MD  [facility] Providers Cardiologist:  Jodelle Red, MD {  History of Present Illness: .   Alexandra Trujillo is a 32 y.o. female without prior cardiac history referred by Dr Tommi Rumps [place] for palpitations.  Today: Tachycardia/palpitations: has noticed for about 10 years, worse after she had Covid. Feels like a hard pounding in her chest -Frequency/Duration: 1-2 nights/week, daily symptoms less common -Associated symptoms: shortness of breath -Aggravating/alleviating factors: worse when she lays on her left side, feels sick, etc. Goes away when she rolls to her right side. 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 Active with her kids the rest of the evening. No intentional exercise -Labs: TSH, kidney function/electrolytes, CBC reviewed. -Cardiac ROS: no chest pain, no shortness of breath, no PND, no orthopnea, no LE edema. -Family history:  Grandfather and grandmother both have pacemakers, grandmother also has some unknown heart disease. Mother has high blood pressure.  ROS: Denies chest pain, shortness of breath at rest or with normal exertion. No PND, orthopnea, LE edema or unexpected weight gain. No syncope or palpitations. ROS otherwise negative except as noted.   Studies Reviewed: Marland Kitchen    EKG:       Physical Exam:   VS:  BP 125/82 (BP Location: Left Arm, Patient Position: Sitting, Cuff Size: Normal)   Pulse 83   Ht 4\' 10"  (1.473 m)   Wt 161 lb (73 kg)   SpO2 98%   BMI 33.65 kg/m    Wt Readings from Last 3  Encounters:  07/22/23 161 lb (73 kg)  02/04/23 159 lb (72.1 kg)  10/03/22 152 lb (68.9 kg)    GEN: Well nourished, well developed in no acute distress HEENT: Normal, moist mucous membranes NECK: No JVD CARDIAC: regular rhythm, normal S1 and S2, no rubs or gallops. No murmur. VASCULAR: Radial and DP pulses 2+ bilaterally. No carotid bruits RESPIRATORY:  Clear to auscultation without rales, wheezing or rhonchi  ABDOMEN: Soft, non-tender, non-distended MUSCULOSKELETAL:  Ambulates independently SKIN: Warm and dry, no edema NEUROLOGIC:  Alert and oriented x 3. No focal neuro deficits noted. PSYCHIATRIC:  Normal affect    ASSESSMENT AND PLAN: .    Palpitations -we discussed the potential causes of fast heart rates and palpitations today. Reviewed the normal electrical system of the heart. Reviewed the role of the sinus node. Reviewed the balance between resting (vagal) tone and fight or flight nervous system input. Reviewed how exercise improves vagal tone and lowers resting heart rate. Reviewed that sinus tachycardia, or elevated sinus rate, is usually secondary to something else in the body. This can include pain, stress, infection, anxiety, hormone imbalance, low blood counts, etc. Discussed that we do not typically treat sinus tachycardia by itself, and instead the focus is on finding what is driving the heart rate and treating that. We discussed that there can be other rhythm issues, from either the top or bottom of the heart, that are abnormal rhythms. Discussed how we evaluate for these.  -discussed monitor vs. Device such as Dance movement psychotherapist. Will pursue 2 week Zio monitor for further  evaluation -reviewed red flag warning signs that need immediate medical attention -family history: grandparents have pacemakers, later in their lives, low suspicion for genetic arrhythmia  CV risk counseling and prevention -recommend heart healthy/Mediterranean diet, with whole grains, fruits,  vegetable, fish, lean meats, nuts, and olive oil. Limit salt. -recommend moderate walking, 3-5 times/week for 30-50 minutes each session. Aim for at least 150 minutes.week. Goal should be pace of 3 miles/hours, or walking 1.5 miles in 30 minutes -recommend avoidance of tobacco products. Avoid excess alcohol. -ASCVD risk score: The ASCVD Risk score (Arnett DK, et al., 2019) failed to calculate for the following reasons:   The 2019 ASCVD risk score is only valid for ages 46 to 27    Dispo: as needed  Signed, Jodelle Red, MD   Jodelle Red, MD, PhD, [facility]  [facility]    Heart & Vascular at [facility] [address], [address] [place], [place] [address] [phone]

## 2023-07-27 DIAGNOSIS — R002 Palpitations: Secondary | ICD-10-CM | POA: Diagnosis not present

## 2023-08-17 DIAGNOSIS — R002 Palpitations: Secondary | ICD-10-CM | POA: Diagnosis not present

## 2023-08-23 ENCOUNTER — Encounter (HOSPITAL_BASED_OUTPATIENT_CLINIC_OR_DEPARTMENT_OTHER): Payer: Self-pay

## 2023-09-07 ENCOUNTER — Telehealth (HOSPITAL_BASED_OUTPATIENT_CLINIC_OR_DEPARTMENT_OTHER): Payer: Self-pay | Admitting: Cardiology

## 2023-09-07 NOTE — Telephone Encounter (Signed)
Patient called to follow-up her heart monitor test results.

## 2023-09-09 ENCOUNTER — Encounter (HOSPITAL_BASED_OUTPATIENT_CLINIC_OR_DEPARTMENT_OTHER): Payer: Self-pay

## 2023-09-09 NOTE — Telephone Encounter (Signed)
Results posted for pt on MyChart. "Seen by patient Alexandra Trujillo on 09/09/2023  2:37 PM"

## 2023-09-17 ENCOUNTER — Telehealth: Payer: BC Managed Care – PPO | Admitting: Nurse Practitioner

## 2023-09-17 DIAGNOSIS — J069 Acute upper respiratory infection, unspecified: Secondary | ICD-10-CM

## 2023-09-17 MED ORDER — PREDNISONE 20 MG PO TABS: 20.0000 mg | ORAL_TABLET | Freq: Two times a day (BID) | ORAL | 0 refills | Status: AC

## 2023-09-17 MED ORDER — ALBUTEROL SULFATE HFA 108 (90 BASE) MCG/ACT IN AERS: 2.0000 | INHALATION_SPRAY | Freq: Four times a day (QID) | RESPIRATORY_TRACT | 0 refills | Status: AC | PRN

## 2023-09-17 MED ORDER — BENZONATATE 200 MG PO CAPS: 200.0000 mg | ORAL_CAPSULE | Freq: Two times a day (BID) | ORAL | 0 refills | Status: AC | PRN

## 2023-09-17 NOTE — Progress Notes (Signed)
 E-Visit for Cough   We are sorry that you are not feeling well.  Here is how we plan to help!  Based on your presentation I believe you most likely have A cough due to a virus.  This is called viral bronchitis and is best treated by rest, plenty of fluids and control of the cough.  You may use Ibuprofen  or Tylenol  as directed to help your symptoms.     In addition you may use A prescription cough medication called Tessalon  Perles 100mg . You may take 1-2 capsules every 8 hours as needed for your cough.  I have also sent albuterol  and prednisone .   From your responses in the eVisit questionnaire you describe inflammation in the upper respiratory tract which is causing a significant cough.  This is commonly called Bronchitis and has four common causes:   Allergies Viral Infections Acid Reflux Bacterial Infection Allergies, viruses and acid reflux are treated by controlling symptoms or eliminating the cause. An example might be a cough caused by taking certain blood pressure medications. You stop the cough by changing the medication. Another example might be a cough caused by acid reflux. Controlling the reflux helps control the cough.  USE OF BRONCHODILATOR (RESCUE) INHALERS: There is a risk from using your bronchodilator too frequently.  The risk is that over-reliance on a medication which only relaxes the muscles surrounding the breathing tubes can reduce the effectiveness of medications prescribed to reduce swelling and congestion of the tubes themselves.  Although you feel brief relief from the bronchodilator inhaler, your asthma may actually be worsening with the tubes becoming more swollen and filled with mucus.  This can delay other crucial treatments, such as oral steroid medications. If you need to use a bronchodilator inhaler daily, several times per day, you should discuss this with your provider.  There are probably better treatments that could be used to keep your asthma under control.      HOME CARE Only take medications as instructed by your medical team. Complete the entire course of an antibiotic. Drink plenty of fluids and get plenty of rest. Avoid close contacts especially the very young and the elderly Cover your mouth if you cough or cough into your sleeve. Always remember to wash your hands A steam or ultrasonic humidifier can help congestion.   GET HELP RIGHT AWAY IF: You develop worsening fever. You become short of breath You cough up blood. Your symptoms persist after you have completed your treatment plan MAKE SURE YOU  Understand these instructions. Will watch your condition. Will get help right away if you are not doing well or get worse.    Thank you for choosing an e-visit.  Your e-visit answers were reviewed by a board certified advanced clinical practitioner to complete your personal care plan. Depending upon the condition, your plan could have included both over the counter or prescription medications.  Please review your pharmacy choice. Make sure the pharmacy is open so you can pick up prescription now. If there is a problem, you may contact your provider through [employer] and have the prescription routed to another pharmacy.  Your safety is important to us . If you have drug allergies check your prescription carefully.   For the next 24 hours you can use MyChart to ask questions about today's visit, request a non-urgent call back, or ask for a work or school excuse. You will get an email in the next two days asking about your experience. I hope that your e-visit  has been valuable and will speed your recovery.

## 2023-09-26 NOTE — Progress Notes (Signed)
 I have provided 5 minutes of non face to face time during this encounter for chart review and documentation.
